# Patient Record
Sex: Male | Born: 1970 | Race: White | Hispanic: No | Smoking: Former smoker
Health system: Southern US, Community
[De-identification: ages and names within clinical notes are randomized; demographics above are authoritative.]

## PROBLEM LIST (undated history)

## (undated) DIAGNOSIS — G4730 Sleep apnea, unspecified: Secondary | ICD-10-CM

## (undated) DIAGNOSIS — I10 Essential (primary) hypertension: Secondary | ICD-10-CM

## (undated) DIAGNOSIS — E349 Endocrine disorder, unspecified: Secondary | ICD-10-CM

## (undated) DIAGNOSIS — M1990 Unspecified osteoarthritis, unspecified site: Secondary | ICD-10-CM

## (undated) DIAGNOSIS — R002 Palpitations: Secondary | ICD-10-CM

## (undated) DIAGNOSIS — G4700 Insomnia, unspecified: Secondary | ICD-10-CM

## (undated) DIAGNOSIS — L309 Dermatitis, unspecified: Secondary | ICD-10-CM

## (undated) HISTORY — DX: Insomnia, unspecified: G47.00

## (undated) HISTORY — DX: Palpitations: R00.2

## (undated) HISTORY — DX: Essential (primary) hypertension: I10

## (undated) HISTORY — DX: Dermatitis, unspecified: L30.9

## (undated) HISTORY — PX: ANKLE RECONSTRUCTION: SHX1151

## (undated) HISTORY — DX: Unspecified osteoarthritis, unspecified site: M19.90

## (undated) HISTORY — DX: Sleep apnea, unspecified: G47.30

## (undated) HISTORY — DX: Endocrine disorder, unspecified: E34.9

## (undated) HISTORY — PX: SPINE SURGERY: SHX786

---

## 2004-08-30 ENCOUNTER — Ambulatory Visit: Payer: Self-pay | Admitting: Family Medicine

## 2005-01-01 ENCOUNTER — Ambulatory Visit: Payer: Self-pay | Admitting: Family Medicine

## 2005-04-30 ENCOUNTER — Ambulatory Visit: Payer: Self-pay | Admitting: Family Medicine

## 2005-09-06 ENCOUNTER — Ambulatory Visit: Payer: Self-pay | Admitting: Family Medicine

## 2005-09-27 ENCOUNTER — Ambulatory Visit: Payer: Self-pay | Admitting: Family Medicine

## 2006-10-08 ENCOUNTER — Ambulatory Visit: Payer: Self-pay | Admitting: Family Medicine

## 2006-12-19 ENCOUNTER — Ambulatory Visit: Payer: Self-pay | Admitting: Family Medicine

## 2009-10-21 ENCOUNTER — Ambulatory Visit (HOSPITAL_COMMUNITY): Admission: RE | Admit: 2009-10-21 | Discharge: 2009-10-22 | Payer: Self-pay | Admitting: Neurosurgery

## 2010-08-31 ENCOUNTER — Observation Stay (HOSPITAL_COMMUNITY): Admission: EM | Admit: 2010-08-31 | Discharge: 2009-10-26 | Payer: Self-pay | Admitting: Emergency Medicine

## 2010-12-11 LAB — URINE MICROSCOPIC-ADD ON

## 2010-12-11 LAB — URINALYSIS, ROUTINE W REFLEX MICROSCOPIC
Glucose, UA: NEGATIVE mg/dL
Specific Gravity, Urine: 1.024 (ref 1.005–1.030)

## 2010-12-11 LAB — BASIC METABOLIC PANEL
BUN: 10 mg/dL (ref 6–23)
CO2: 28 mEq/L (ref 19–32)
CO2: 30 mEq/L (ref 19–32)
Calcium: 8.5 mg/dL (ref 8.4–10.5)
Creatinine, Ser: 0.81 mg/dL (ref 0.4–1.5)
GFR calc Af Amer: >60 mL/min (ref >60)
GFR calc Af Amer: >60 mL/min (ref >60)
Sodium: 138 mEq/L (ref 135–145)

## 2010-12-11 LAB — CBC
HCT: 42 % (ref 39.0–52.0)
MCV: 87.4 fL (ref 78.0–100.0)
Platelets: 225 10*3/uL (ref 150–400)
Platelets: 245 10*3/uL (ref 150–400)
RBC: 4.81 MIL/uL (ref 4.22–5.81)
WBC: 8.2 10*3/uL (ref 4.0–10.5)
WBC: 9.1 10*3/uL (ref 4.0–10.5)

## 2010-12-11 LAB — DIFFERENTIAL
Basophils Relative: 0 % (ref 0–1)
Eosinophils Absolute: 0.2 10*3/uL (ref 0.0–0.7)
Eosinophils Relative: 2 % (ref 0–5)
Lymphs Abs: 2.3 10*3/uL (ref 0.7–4.0)

## 2010-12-14 LAB — BASIC METABOLIC PANEL
BUN: 13 mg/dL (ref 6–23)
Calcium: 8.7 mg/dL (ref 8.4–10.5)
GFR calc Af Amer: >60 mL/min (ref >60)
GFR calc non Af Amer: >60 mL/min (ref >60)
Glucose, Bld: 114 mg/dL — ABNORMAL HIGH (ref 70–99)
Potassium: 4.1 mEq/L (ref 3.5–5.1)

## 2011-01-25 ENCOUNTER — Other Ambulatory Visit (HOSPITAL_COMMUNITY): Payer: Self-pay | Admitting: Neurosurgery

## 2011-01-25 DIAGNOSIS — M5410 Radiculopathy, site unspecified: Secondary | ICD-10-CM

## 2011-01-25 DIAGNOSIS — M5020 Other cervical disc displacement, unspecified cervical region: Secondary | ICD-10-CM

## 2011-02-01 IMAGING — CR DG CHEST 2V
2 series · 2 of 2 positions shown · non-contrast
Comparison: None available.

CLINICAL DATA: Preoperative respiratory film for patient with
cervical disc herniation.

CHEST - 2 VIEW

[view not recorded (1 of 2)]
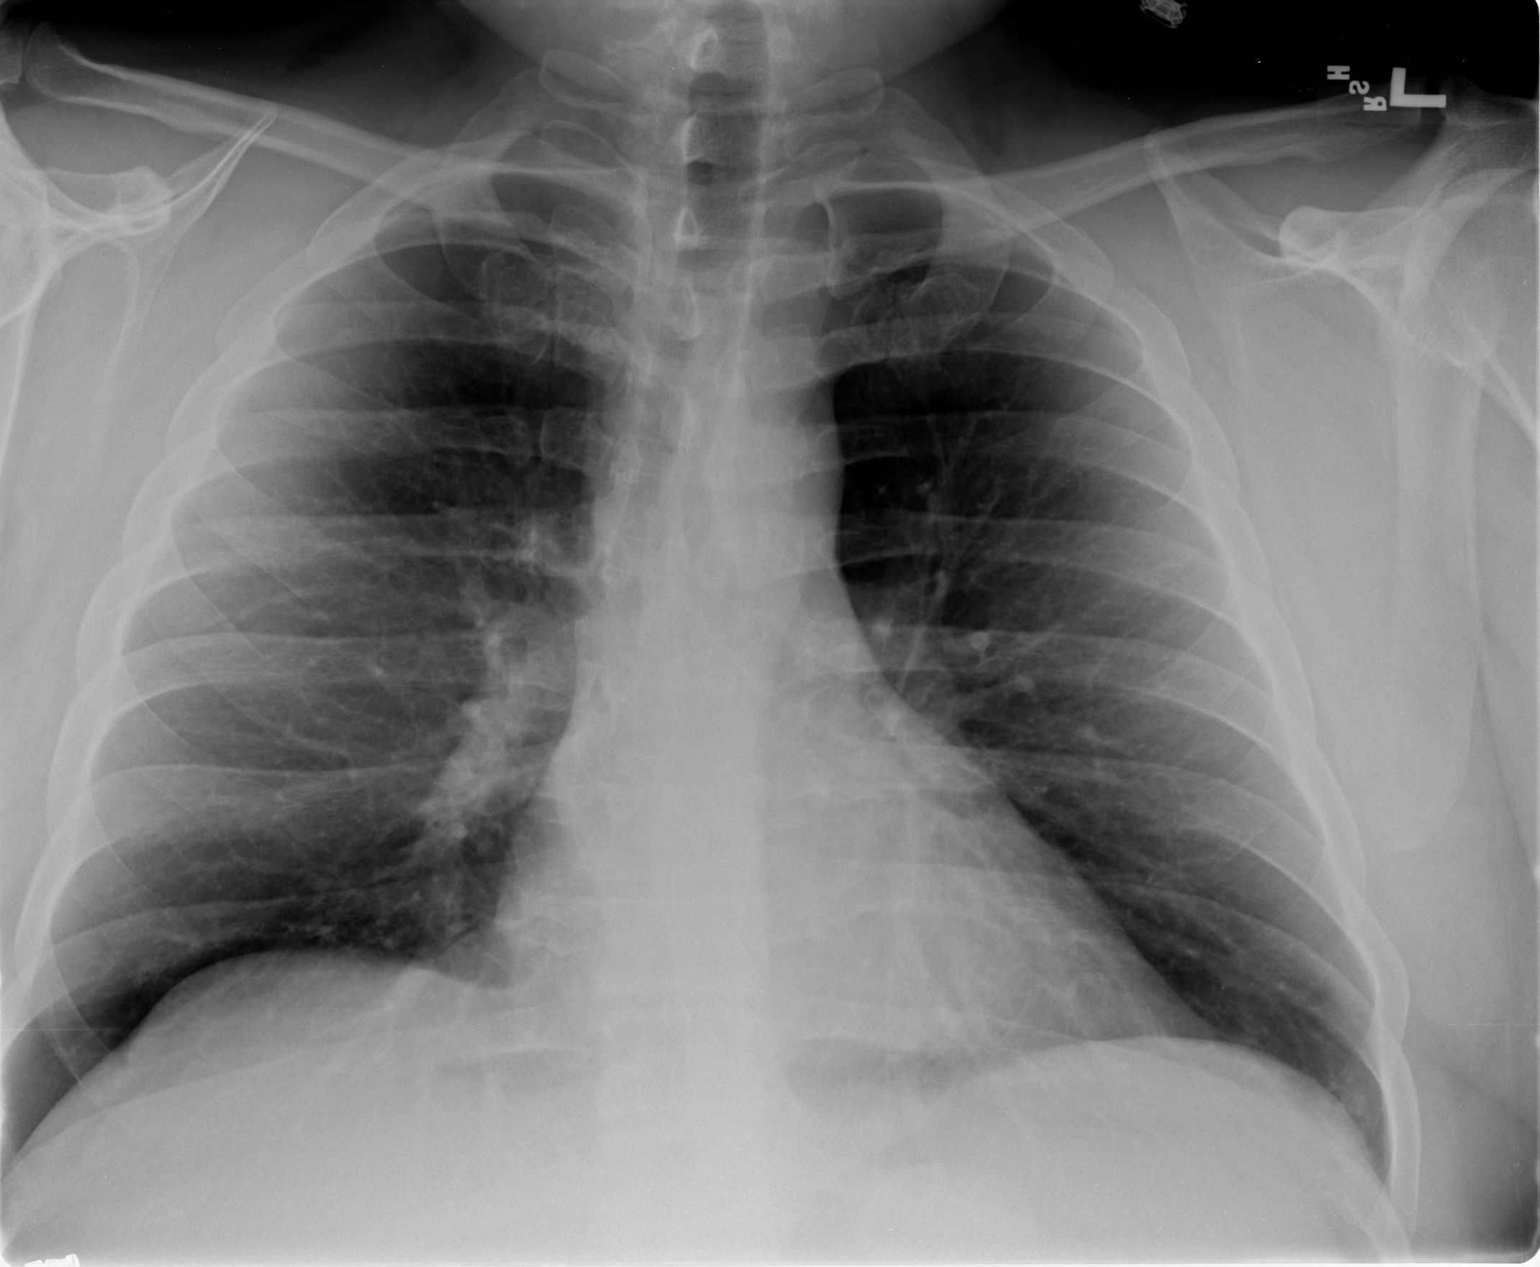

[view not recorded (2 of 2)]
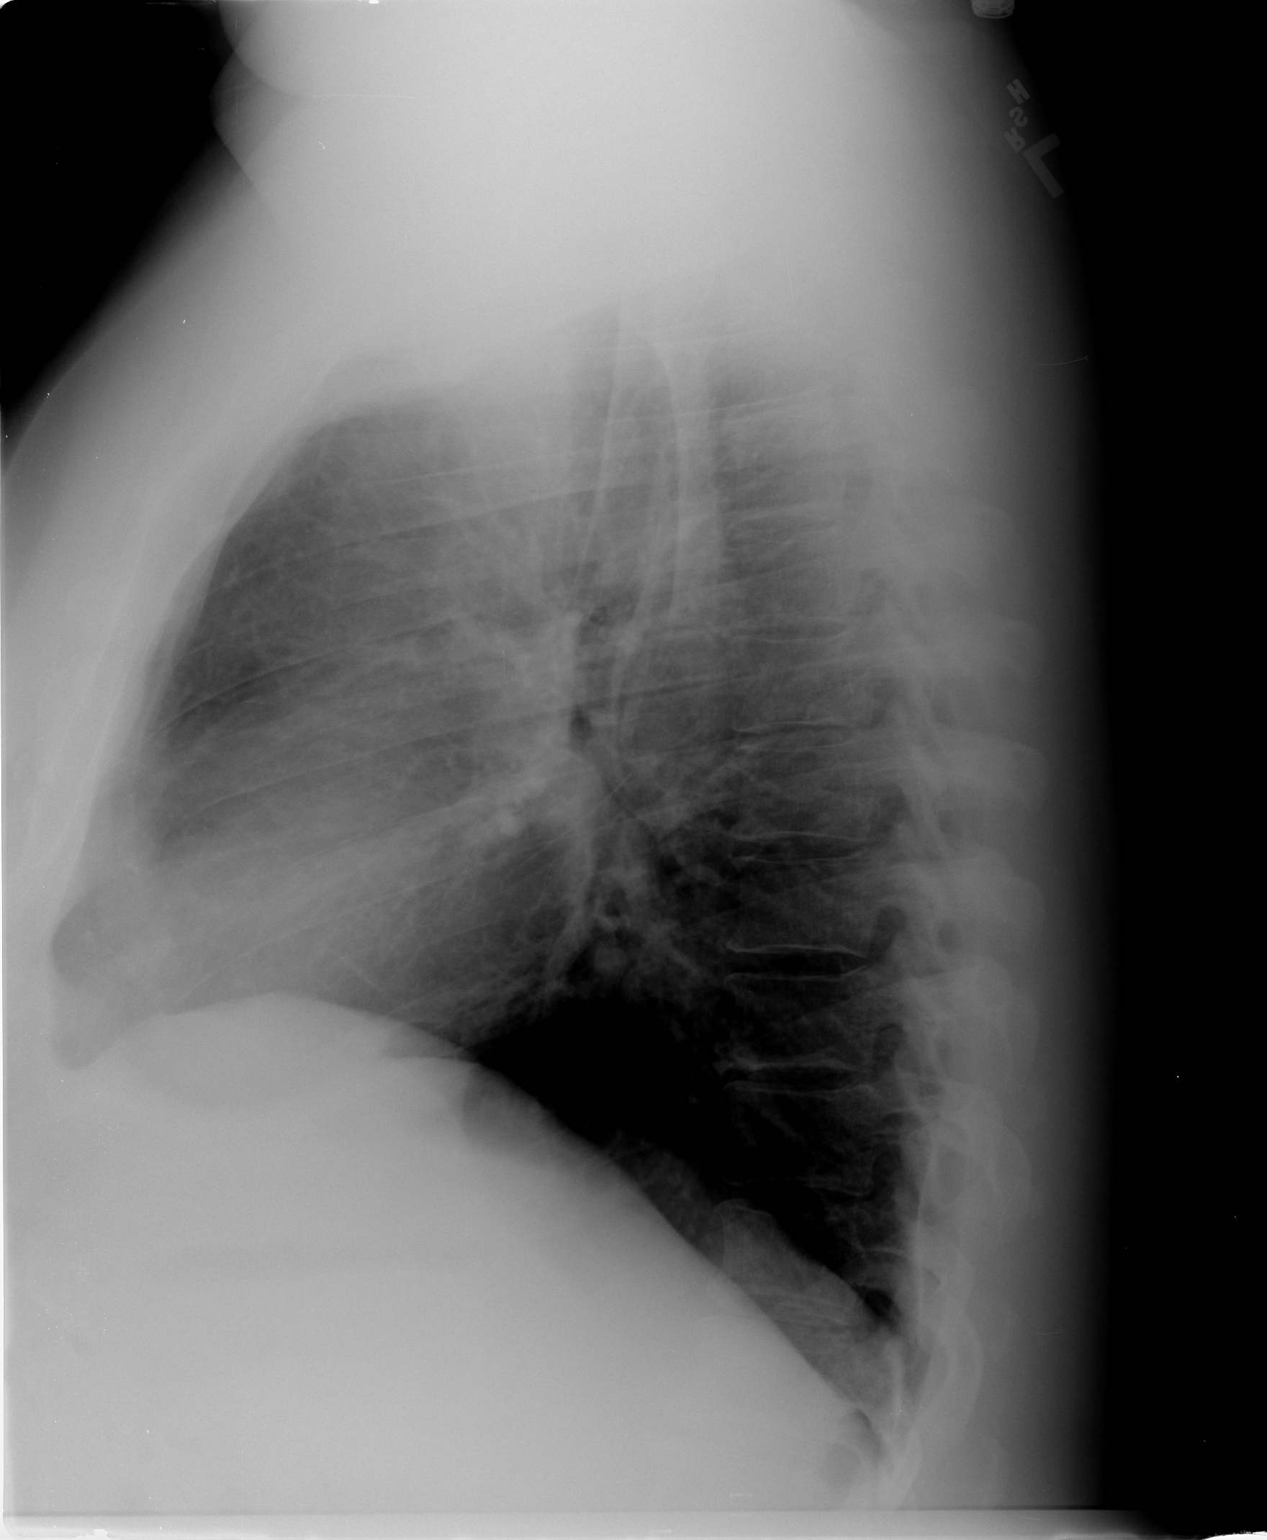

[2 of 2 positions shown; findings below may reference images not displayed]

FINDINGS: The lungs are clear.  Heart size is normal.  No pleural
effusion or focal bony abnormality.
IMPRESSION: No acute disease.

## 2011-02-04 IMAGING — CR DG CHEST 2V
2 series · 2 of 2 positions shown · non-contrast
Comparison: 10/21/2009

CLINICAL DATA: Bleeding edema post neck surgery

CHEST - 2 VIEW

[w chest pa]
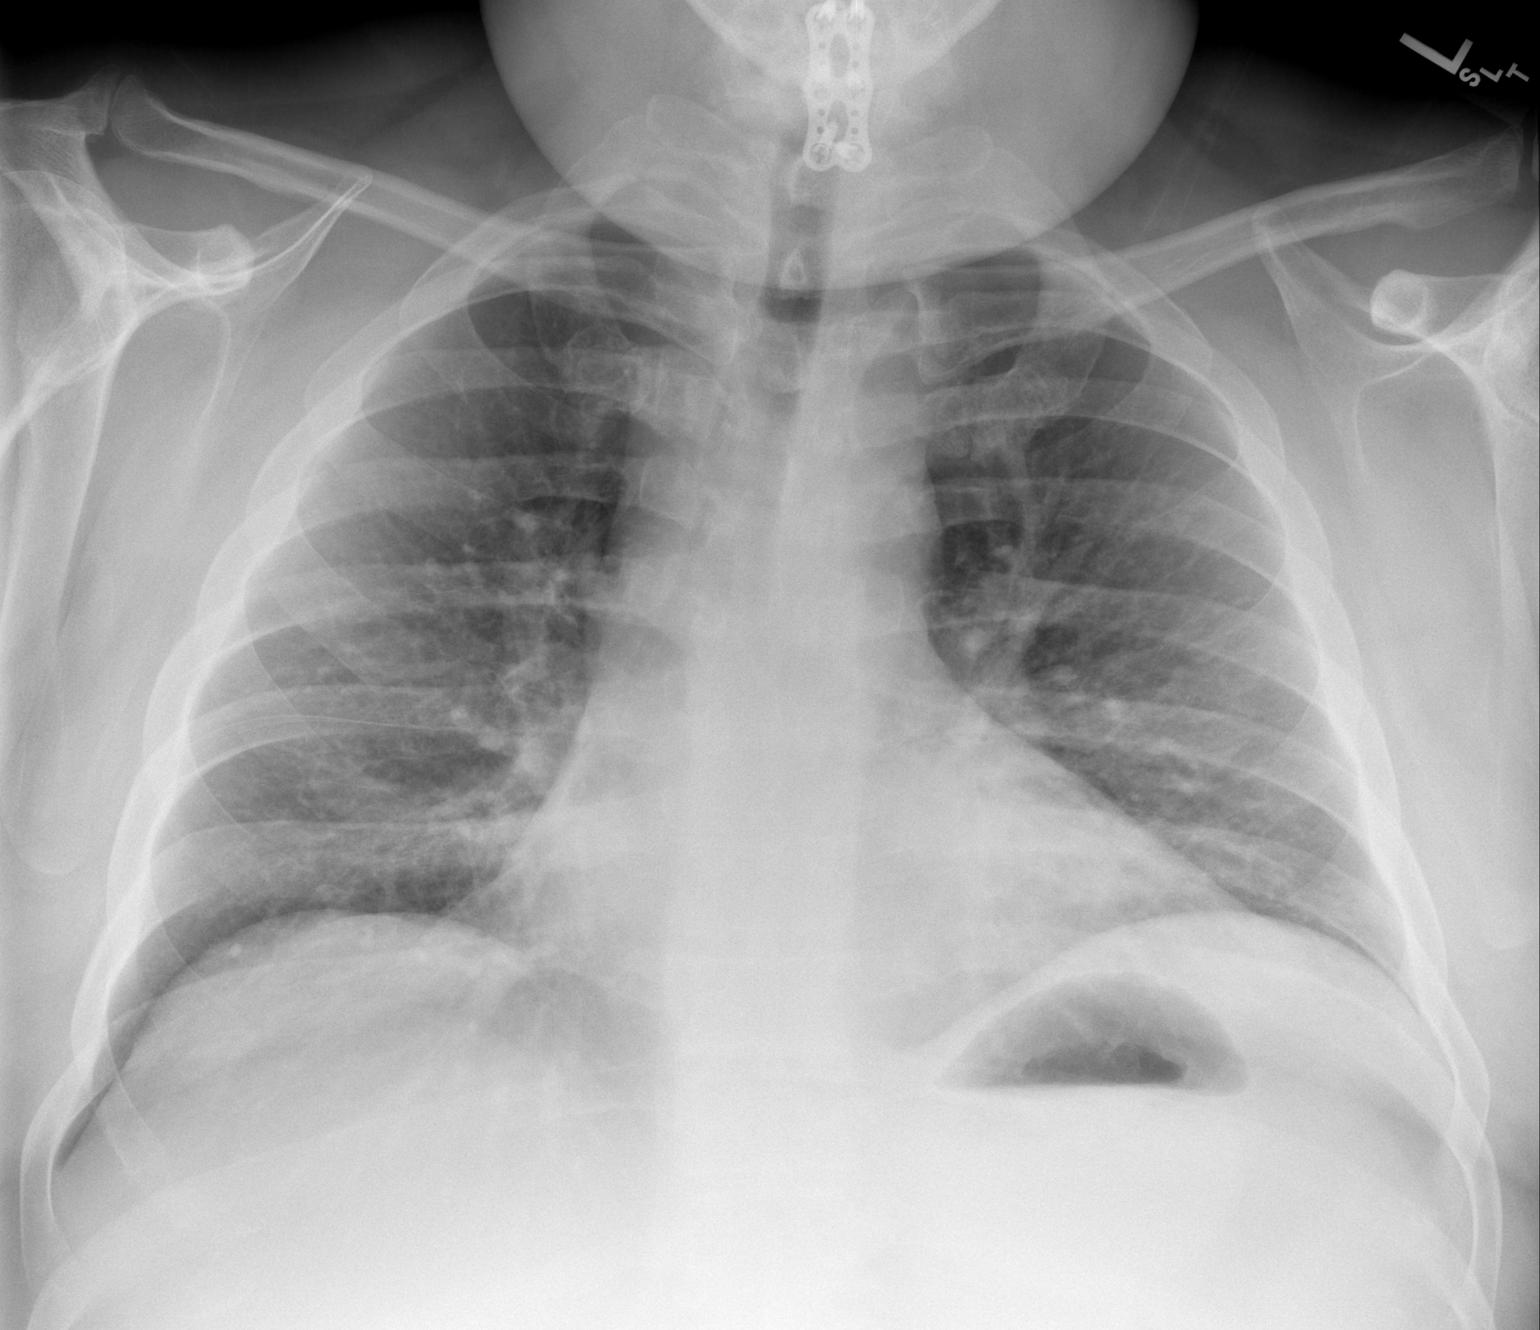

[w chest lat]
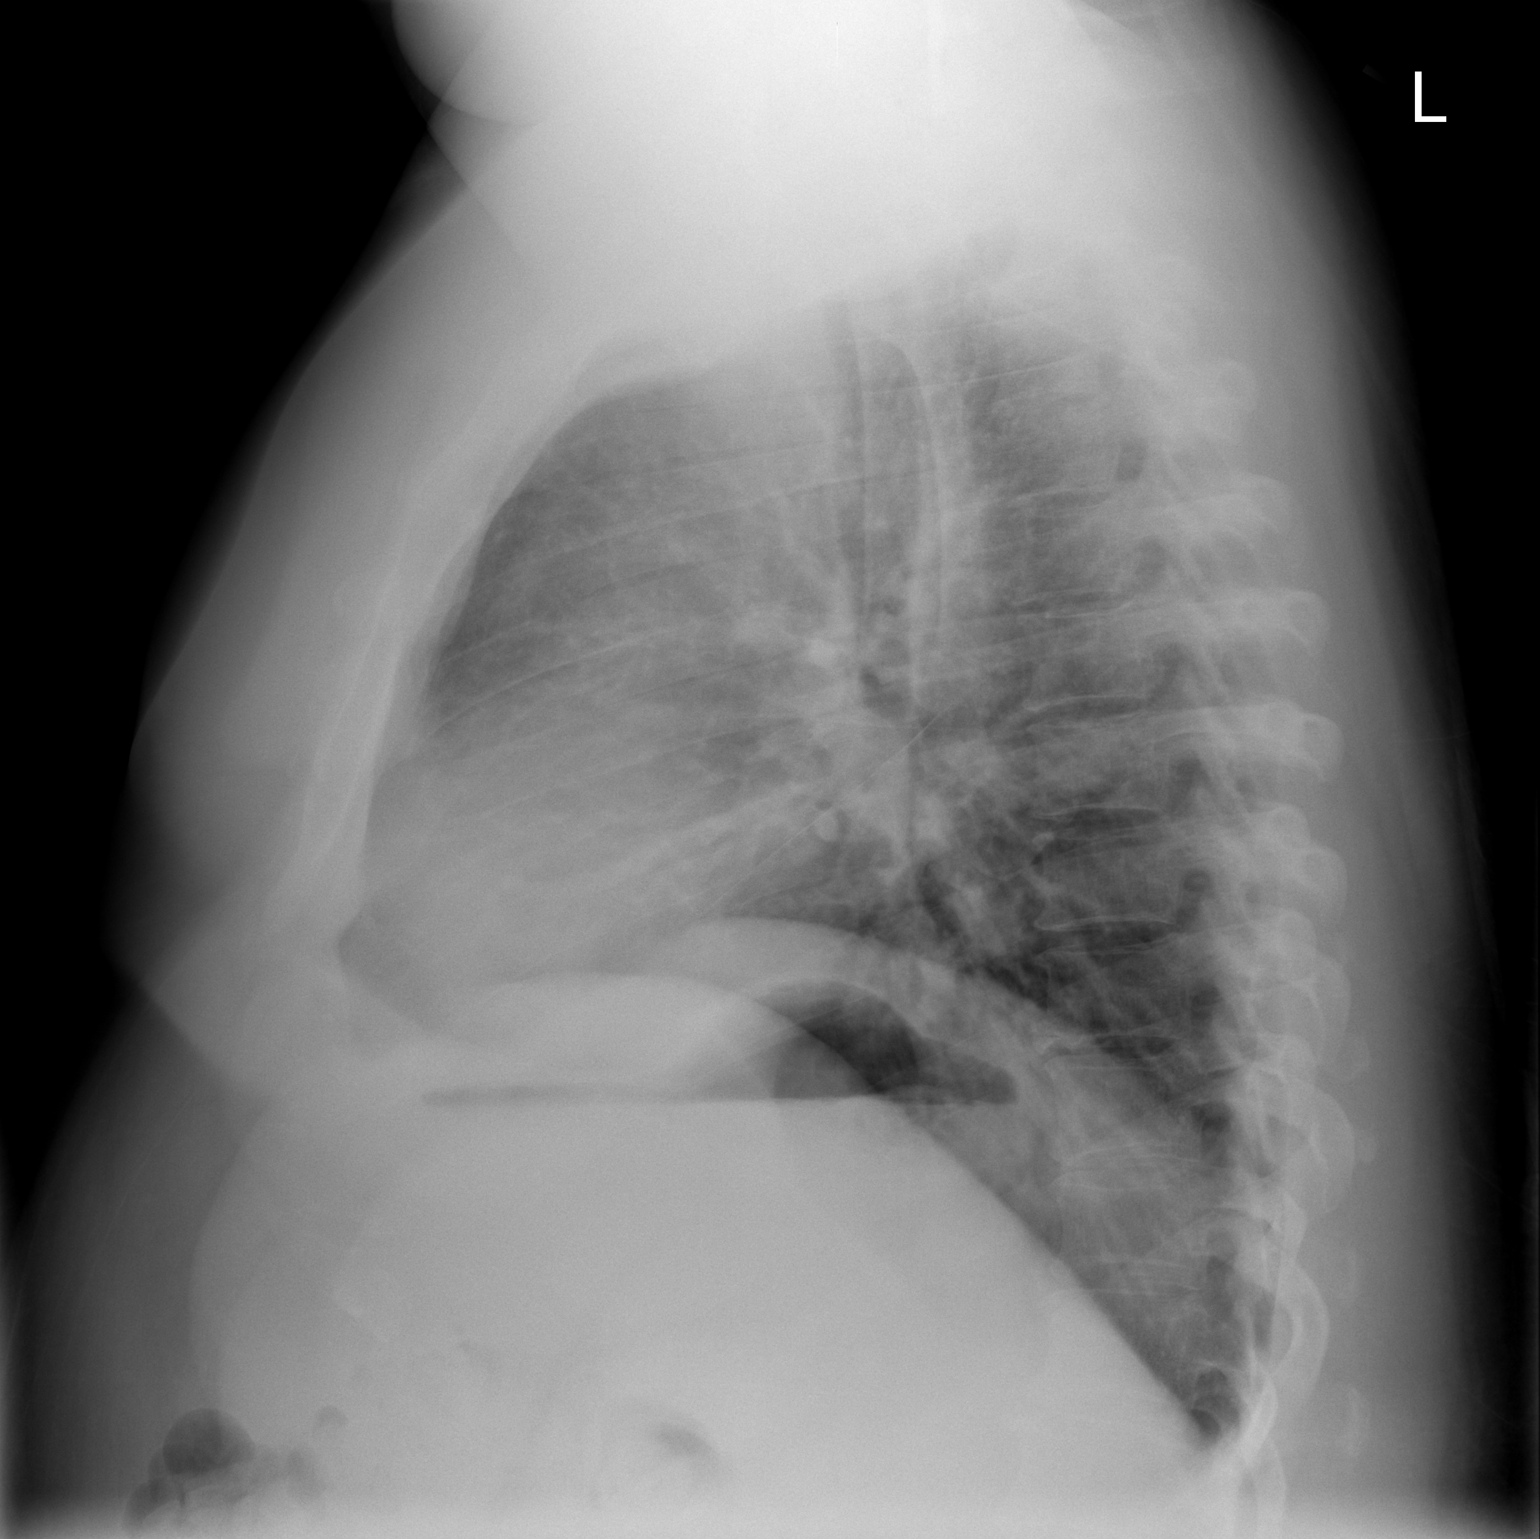

[2 of 2 positions shown; findings below may reference images not displayed]

FINDINGS: Low level of inspiration.  Heart size upper normal
considering.  No congestive heart failure or pleural fluid.  Lungs
clear.

Prior cervical fusion.
IMPRESSION: Suboptimal level of inspiration - no congestive heart failure or
active disease.

## 2011-02-05 ENCOUNTER — Ambulatory Visit (HOSPITAL_COMMUNITY)
Admission: RE | Admit: 2011-02-05 | Discharge: 2011-02-05 | Disposition: A | Discharge status: Home / Self Care | Payer: Worker's Compensation | Source: Ambulatory Visit | Attending: Neurosurgery | Admitting: Neurosurgery

## 2011-02-05 ENCOUNTER — Other Ambulatory Visit (HOSPITAL_COMMUNITY): Payer: Self-pay

## 2011-02-05 ENCOUNTER — Ambulatory Visit (HOSPITAL_COMMUNITY): Payer: Worker's Compensation

## 2011-02-05 DIAGNOSIS — M5020 Other cervical disc displacement, unspecified cervical region: Secondary | ICD-10-CM

## 2011-02-05 DIAGNOSIS — M5410 Radiculopathy, site unspecified: Secondary | ICD-10-CM

## 2011-02-08 ENCOUNTER — Ambulatory Visit (HOSPITAL_COMMUNITY): Admission: RE | Admit: 2011-02-08 | Payer: Worker's Compensation | Source: Ambulatory Visit

## 2011-02-08 ENCOUNTER — Ambulatory Visit (HOSPITAL_COMMUNITY)
Admission: RE | Admit: 2011-02-08 | Discharge: 2011-02-08 | Disposition: A | Discharge status: Home / Self Care | Payer: Worker's Compensation | Source: Ambulatory Visit | Attending: Neurosurgery | Admitting: Neurosurgery

## 2011-02-08 ENCOUNTER — Other Ambulatory Visit (HOSPITAL_COMMUNITY): Payer: Self-pay | Admitting: Neurosurgery

## 2011-02-08 DIAGNOSIS — M4802 Spinal stenosis, cervical region: Secondary | ICD-10-CM | POA: Insufficient documentation

## 2011-02-08 DIAGNOSIS — M5020 Other cervical disc displacement, unspecified cervical region: Secondary | ICD-10-CM

## 2011-02-08 DIAGNOSIS — IMO0002 Reserved for concepts with insufficient information to code with codable children: Secondary | ICD-10-CM | POA: Insufficient documentation

## 2011-02-08 DIAGNOSIS — M5410 Radiculopathy, site unspecified: Secondary | ICD-10-CM

## 2011-02-08 DIAGNOSIS — M79609 Pain in unspecified limb: Secondary | ICD-10-CM | POA: Insufficient documentation

## 2011-02-08 DIAGNOSIS — Z981 Arthrodesis status: Secondary | ICD-10-CM | POA: Insufficient documentation

## 2011-02-08 DIAGNOSIS — R29898 Other symptoms and signs involving the musculoskeletal system: Secondary | ICD-10-CM | POA: Insufficient documentation

## 2011-02-08 DIAGNOSIS — M6281 Muscle weakness (generalized): Secondary | ICD-10-CM | POA: Insufficient documentation

## 2011-02-08 LAB — POCT I-STAT, CHEM 8
Chloride: 104 mEq/L (ref 96–112)
Creatinine, Ser: 1 mg/dL (ref 0.4–1.5)
Glucose, Bld: 103 mg/dL — ABNORMAL HIGH (ref 70–99)
HCT: 47 % (ref 39.0–52.0)
Potassium: 4.1 mEq/L (ref 3.5–5.1)
Sodium: 137 mEq/L (ref 135–145)

## 2011-02-08 MED ORDER — IOHEXOL 300 MG/ML  SOLN
10.0000 mL | Freq: Once | INTRAMUSCULAR | Status: AC | PRN
  Administered 2011-02-08: 10 mL via INTRATHECAL

## 2012-05-21 IMAGING — CT CT CERVICAL SPINE W/ CM
4 of 5 series · 13 of 28 positions shown, 14 images · IV contrast (omnipaque)
Comparison: CT cervical spine 10/23/2009

CLINICAL DATA: Radiculopathy.  Right arm weakness.  Left arm pain.

MYELOGRAM INJECTION
TECHNIQUE: Informed consent was obtained from the patient prior to
the procedure, including potential complications of headache,
allergy, infection and pain.  A timeout procedure was performed.
With the patient prone, the lower back was prepped with Betadine.
1% Lidocaine was used for local anesthesia.  Lumbar puncture was
performed at the left paramidline L2-3 level using a 22 gauge
needle with return of clear CSF.  10.0 ml of Omnipaque 583was
injected into the subarachnoid space .
TECHNIQUE: Following injection of intrathecal Omnipaque contrast,
spine imaging in multiple projections was performed using
fluoroscopy.
Fluoroscopy Time: 1.07 minutes.
TECHNIQUE: CT imaging of the cervical spine was performed after
intrathecal contrast administration. Multiplanar CT image
reconstructions were also generated.

[Series 2: cervical spine · axial · 0.27mm/px · z∈[+90,+185]mm · 3 of 76 slices shown, 4 images]
[im 19/76  soft-tissue]
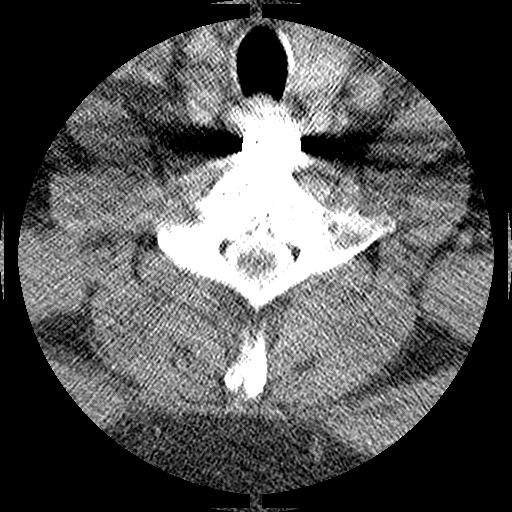
[im 19/76  bone]
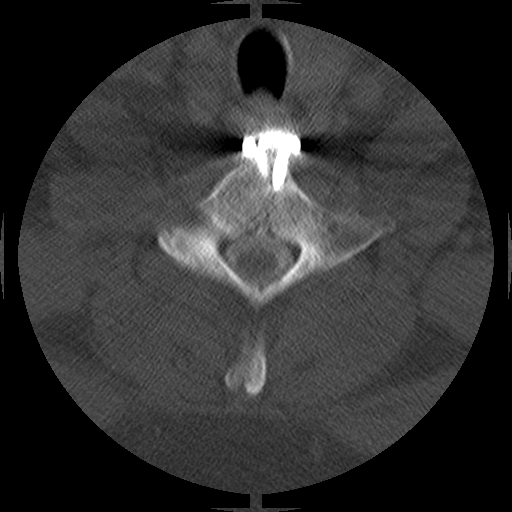
[im 38/76  bone]
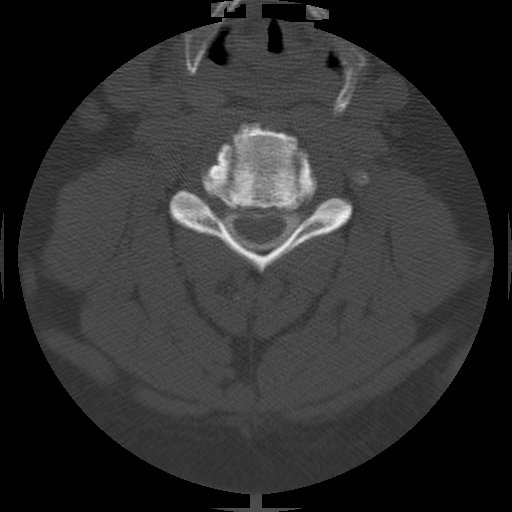
[im 57/76  bone]
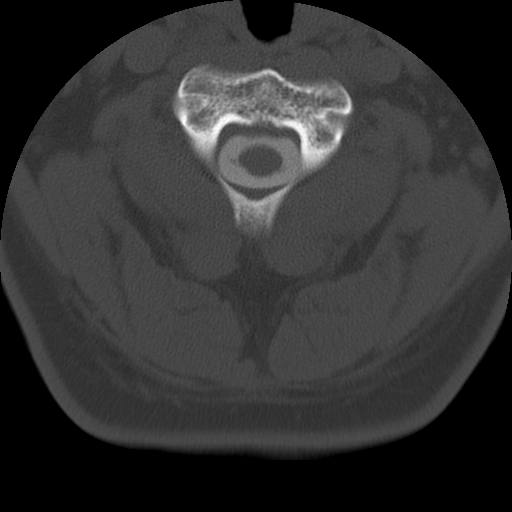

[Series 3: recon 2: cervical spine · axial · 0.27mm/px · z∈[+108,+170]mm · 2 of 76 slices shown]
[im 26/76  bone]
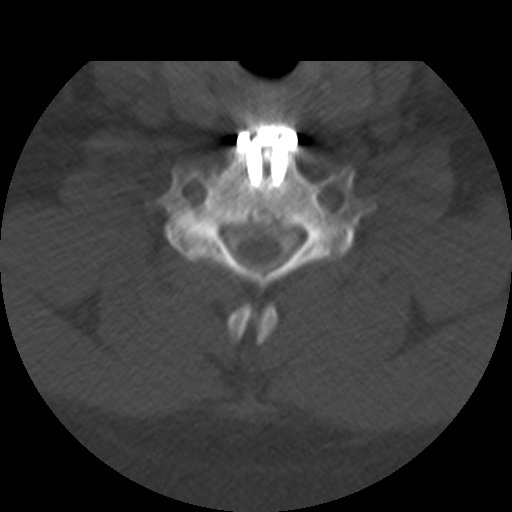
[im 51/76  bone]
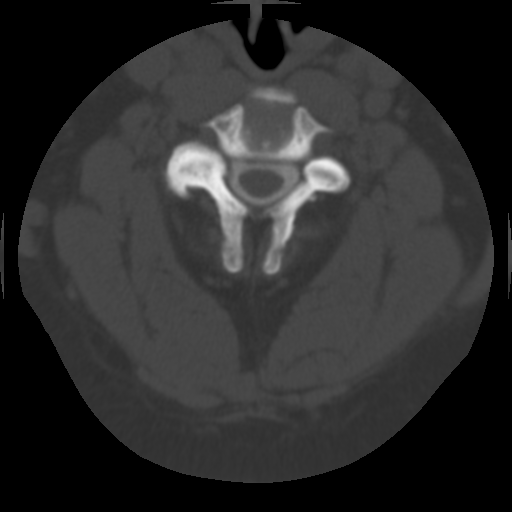

[Series 400: cor · coronal · 0.38mm/px · 5 of 51 slices shown]
[im 18/51  bone]
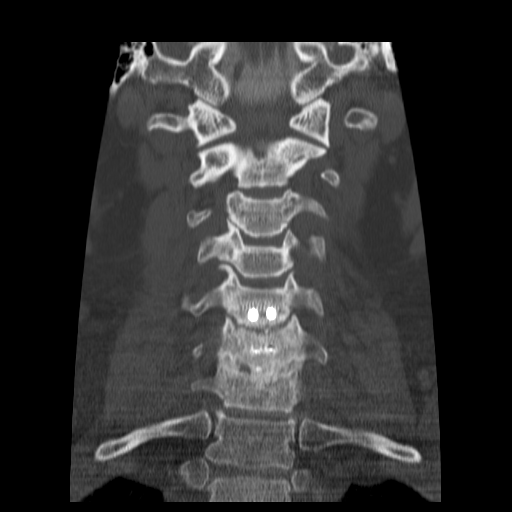
[im 22/51  bone]
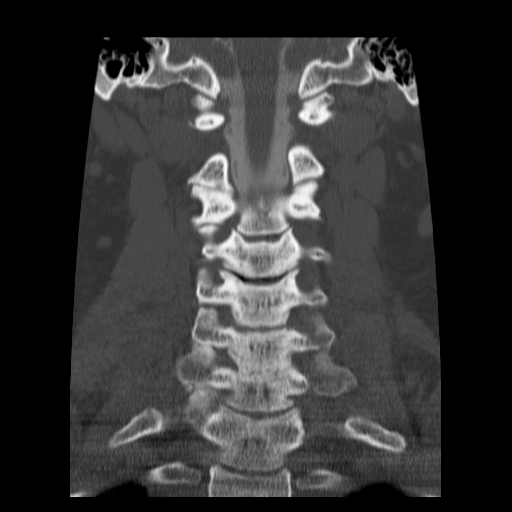
[im 27/51  bone]
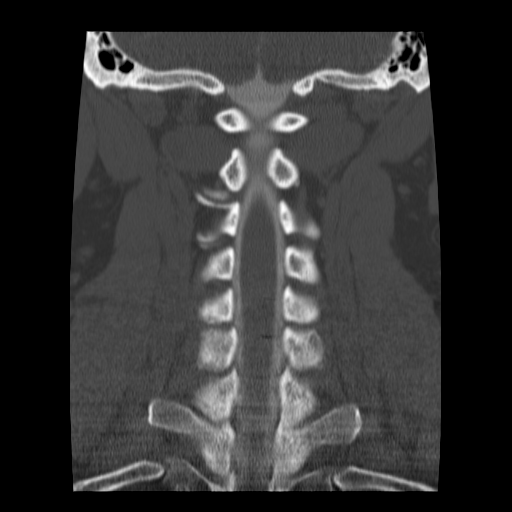
[im 31/51  bone]
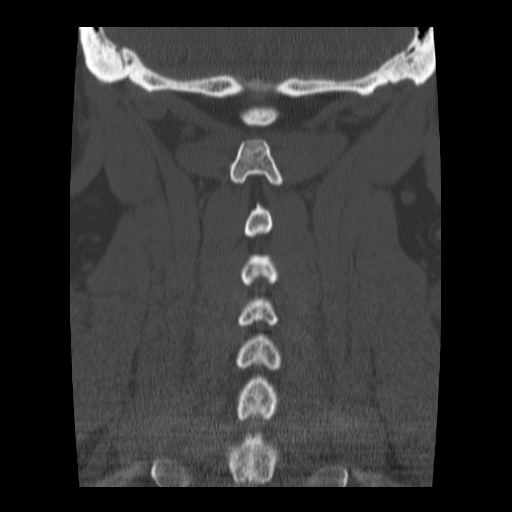
[im 35/51  bone]
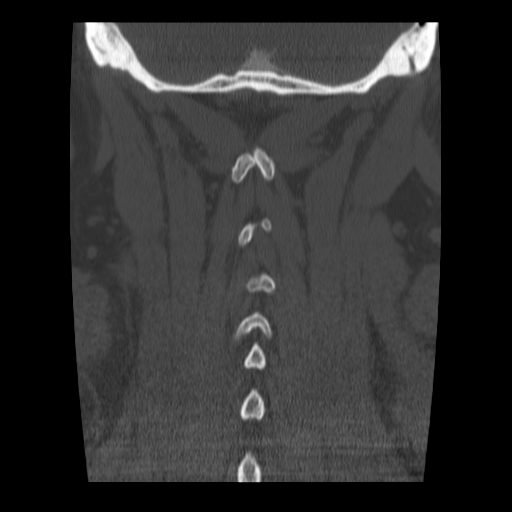

[Series 402: orthoganal · axial · 0.38mm/px · z∈[+97,+182]mm · 3 of 88 slices shown]
[im 22/88  bone]
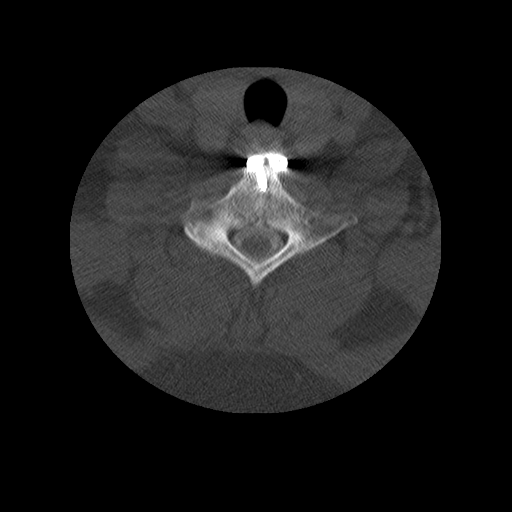
[im 44/88  bone]
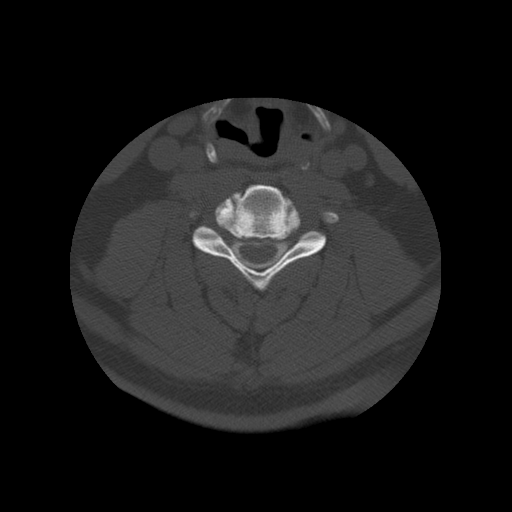
[im 66/88  bone]
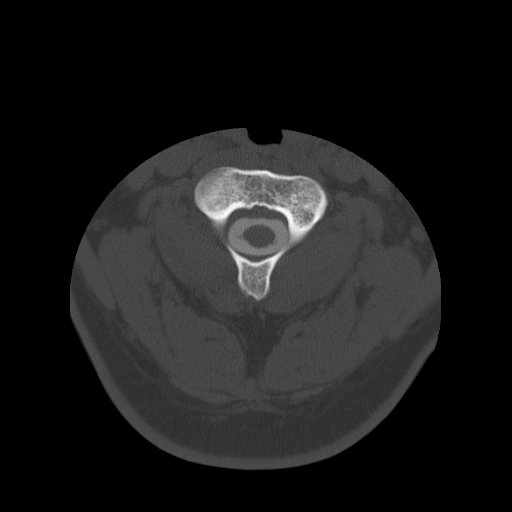

[13 of 28 positions shown; findings below may reference images not displayed]

IMPRESSION: Successful injection of  intrathecal contrast for myelography.

MYELOGRAM CERVICAL
FINDINGS: The patient is status post anterior fusion at C5-6 and C6-
7.  A prominent disc osteophyte complex at C3-4 effaces the ventral
CSF with significant narrowing of the central canal.  A prominent
disc osteophyte complex is present at C4-5 with less severe
effacement of the ventral CSF.
IMPRESSION: 1.  Broad-based disc osteophyte complex at C3-4 with associated
central canal stenosis.
2.  Smaller disc osteophyte complex at C4-5.
3.  Status post anterior fusion at C5-6 and C6-7.


CT MYELOGRAPHY CERVICAL SPINE
FINDINGS: The cervical spine is imaged from the skull base through
T1-2.  The patient is status post anterior fusion at C5-6 and C6-7.
The osseous fusion is mature across the disc space at C6-7.
Minimal anterior bridging bone is present at C5-6.

Reversal of the normal cervical lordosis is similar to the prior
exam.  The soft tissues of the neck are unremarkable.  The
individual disc levels are as follows.

C2-3:  No significant disc herniation or stenosis is present.

C3-4:  A broad-based disc osteophyte complex is present.  This
effaces the ventral CSF and may contact the cord.  Mild
uncovertebral disease is evident bilaterally.  No significant
foraminal stenosis is present.

C4-5:  A broad-based disc osteophyte complex partially effaces the
ventral CSF.  Prominent uncovertebral disease is seen bilaterally.
Mild foraminal stenosis is seen bilaterally, worse on the right.

C5-6:  The patient is fused.  Minimal posterior osteophytic ridging
is present with slight effacement ventral CSF.  No focal stenosis
is present.

C6-7:  Right paracentral osteophyte effaces the ventral CSF and
could contact the cord.  The foramen are patent bilaterally.

C7-T1:  Left-sided uncovertebral spurring leads to mild foraminal
narrowing.
IMPRESSION: 1.  Mild to moderate central canal stenosis at C3-4 secondary to a
broad-based disc osteophyte complex.
2.  Mild foraminal stenosis bilaterally at C4-5 is worse on the
right.
3.  Mild central canal stenosis at C4-5.
4.  Status post anterior fusion at C5-6 and C6-7.  There is only
minimal bridging bone anteriorly at C5-6.  Bridging bone extends
across the entire endplate at C6-7.
5.  Right paracentral posterior osteophyte at C6-7 potentially
contacts the cord with mild to moderate central canal stenosis.

## 2012-10-27 ENCOUNTER — Other Ambulatory Visit (HOSPITAL_COMMUNITY): Payer: Self-pay | Admitting: Pulmonary Disease

## 2012-10-27 ENCOUNTER — Ambulatory Visit (HOSPITAL_COMMUNITY)
Admission: RE | Admit: 2012-10-27 | Discharge: 2012-10-27 | Disposition: A | Discharge status: Home / Self Care | Payer: PRIVATE HEALTH INSURANCE | Source: Ambulatory Visit | Attending: Pulmonary Disease | Admitting: Pulmonary Disease

## 2012-10-27 DIAGNOSIS — M25529 Pain in unspecified elbow: Secondary | ICD-10-CM

## 2014-08-30 DIAGNOSIS — E291 Testicular hypofunction: Secondary | ICD-10-CM | POA: Insufficient documentation

## 2015-05-26 DIAGNOSIS — R600 Localized edema: Secondary | ICD-10-CM | POA: Insufficient documentation

## 2015-05-26 DIAGNOSIS — I10 Essential (primary) hypertension: Secondary | ICD-10-CM | POA: Insufficient documentation

## 2019-05-01 ENCOUNTER — Ambulatory Visit: Payer: Self-pay | Admitting: Family

## 2019-05-05 ENCOUNTER — Other Ambulatory Visit: Payer: Self-pay

## 2019-05-06 ENCOUNTER — Encounter: Payer: Self-pay | Admitting: Family Medicine

## 2019-05-06 ENCOUNTER — Ambulatory Visit: Payer: Managed Care, Other (non HMO) | Admitting: Family Medicine

## 2019-05-06 VITALS — BP 159/94 | HR 90 | Temp 98.9°F | Resp 18 | Ht 72.0 in | Wt 373.0 lb

## 2019-05-06 DIAGNOSIS — Z7689 Persons encountering health services in other specified circumstances: Secondary | ICD-10-CM | POA: Diagnosis not present

## 2019-05-06 DIAGNOSIS — I10 Essential (primary) hypertension: Secondary | ICD-10-CM | POA: Diagnosis not present

## 2019-05-06 DIAGNOSIS — Z1211 Encounter for screening for malignant neoplasm of colon: Secondary | ICD-10-CM

## 2019-05-06 DIAGNOSIS — R002 Palpitations: Secondary | ICD-10-CM | POA: Diagnosis not present

## 2019-05-06 DIAGNOSIS — Z1212 Encounter for screening for malignant neoplasm of rectum: Secondary | ICD-10-CM

## 2019-05-06 DIAGNOSIS — E291 Testicular hypofunction: Secondary | ICD-10-CM

## 2019-05-06 MED ORDER — OLMESARTAN MEDOXOMIL 20 MG PO TABS: 20.0000 mg | ORAL_TABLET | Freq: Every day | ORAL | 3 refills | Status: DC

## 2019-05-06 NOTE — Progress Notes (Signed)
Subjective:  Patient ID: Francisco Adams, male    DOB: Mar 24, 1971, 48 y.o.   MRN: [id number]  Patient Care Team: Baruch Gouty, FNP as PCP - General (Family Medicine)   Chief Complaint:  Establish Care ([place])   HPI: Francisco Adams is a 48 y.o. male presenting on 05/06/2019 for Establish Care ([place])   1. Encounter to establish care  Pt presents today to establish care. Pt was seeing Dr. Edrick Oh who has retired.    2. Palpitations  On metoprolol for rate control. No recent episodes. Had negative stress test and EKG when this started.    3. Essential hypertension with goal blood pressure less than 130/80  Taking metoprolol only. States BP is always elevated, running 150/90 most of the time. He states a few months ago he did have some visual disturbances. States this lasted for about 5 minutes and then went away. Pt states he is planning on following up with ophthalmology. He denies dizziness, chest pain, shortness of breath, dizziness, headaches, or confusion. States he does have lower leg edema. States mostly at the end of work. Subsides with elevations.     4. Hypogonadism in male  On testosterone. Last level drawn in 2019 and was low. Will place orders for testosterone level draw in am. Pt aware this needs to be drawn prior to 10 am.    5. Morbid obesity ([place])  Does not watch diet or exercise on a regular basis.      Relevant past medical, surgical, family, and social history reviewed and updated as indicated.  Allergies and medications reviewed and updated. Date reviewed: Chart in Epic.   Past Medical History:  Diagnosis Date   Arthritis    Dermatitis    head  and face    Palpitations    Sleep apnea    cpap    Testosterone deficiency     Past Surgical History:  Procedure Laterality Date   ANKLE RECONSTRUCTION Right    SPINE SURGERY     cervical - 3 disc fusions    Social History   Socioeconomic History   Marital status: Divorced    Spouse name:  Not on file   Number of children: 3   Years of education: Not on file   Highest education level: Not on file  Occupational History   Not on file  Social Needs   Financial resource strain: Not on file   Food insecurity    Worry: Not on file    Inability: Not on file   Transportation needs    Medical: Not on file    Non-medical: Not on file  Tobacco Use   Smoking status: Former Smoker   Smokeless tobacco: Current User    Types: Chew  Substance and Sexual Activity   Alcohol use: Never    Frequency: Never   Drug use: Never   Sexual activity: Not on file  Lifestyle   Physical activity    Days per week: Not on file    Minutes per session: Not on file   Stress: Not on file  Relationships   Social connections    Talks on phone: Not on file    Gets together: Not on file    Attends religious service: Not on file    Active member of club or organization: Not on file    Attends meetings of clubs or organizations: Not on file    Relationship status: Not on file   Intimate partner violence  Fear of current or ex partner: Not on file    Emotionally abused: Not on file    Physically abused: Not on file    Forced sexual activity: Not on file  Other Topics Concern   Not on file  Social History Narrative   Not on file    Outpatient Encounter Medications as of 05/06/2019  Medication Sig   clotrimazole-betamethasone (LOTRISONE) cream Apply 1 application topically 2 (two) times daily.   metoprolol succinate (TOPROL-XL) 25 MG 24 hr tablet Take 1 tablet by mouth daily.   Testosterone 20.25 MG/ACT (1.62%) GEL Apply 2 Pump topically daily.   olmesartan (BENICAR) 20 MG tablet Take 1 tablet (20 mg total) by mouth daily.   No facility-administered encounter medications on file as of 05/06/2019.     Not on File  Review of Systems  Constitutional: Negative for activity change, appetite change, chills, fatigue and fever.  HENT: Negative.   Eyes: Positive for visual  disturbance. Negative for photophobia.  Respiratory: Negative for cough, chest tightness and shortness of breath.   Cardiovascular: Positive for leg swelling. Negative for chest pain and palpitations.  Gastrointestinal: Negative for abdominal distention, abdominal pain, anal bleeding, blood in stool, constipation, diarrhea, nausea and vomiting.  Endocrine: Negative.  Negative for cold intolerance, heat intolerance, polydipsia, polyphagia and polyuria.  Genitourinary: Negative for decreased urine volume, difficulty urinating, discharge, dysuria, frequency, penile pain, penile swelling, scrotal swelling, testicular pain and urgency.  Musculoskeletal: Negative for arthralgias and myalgias.  Skin: Negative.  Negative for color change and pallor.  Allergic/Immunologic: Negative.   Neurological: Negative for dizziness, tremors, facial asymmetry, speech difficulty, weakness, light-headedness, numbness and headaches.  Hematological: Negative.  Does not bruise/bleed easily.  Psychiatric/Behavioral: Negative for agitation, confusion, hallucinations, sleep disturbance and suicidal ideas.  All other systems reviewed and are negative.       Objective:  BP (!) 159/94    Pulse 90    Temp 98.9 F (37.2 C)    Resp 18    Ht 6' (1.829 m)    Wt (!) 373 lb (169.2 kg)    BMI 50.59 kg/m    Wt Readings from Last 3 Encounters:  05/06/19 (!) 373 lb (169.2 kg)    Physical Exam Vitals signs and nursing note reviewed.  Constitutional:      General: He is not in acute distress.    Appearance: Normal appearance. He is well-developed and well-groomed. He is obese. He is not ill-appearing, toxic-appearing or diaphoretic.  HENT:     Head: Normocephalic and atraumatic.     Jaw: There is normal jaw occlusion.     Right Ear: Hearing normal.     Left Ear: Hearing normal.     Nose: Nose normal.     Mouth/Throat:     Lips: Pink.     Mouth: Mucous membranes are moist.     Pharynx: Oropharynx is clear. Uvula midline.    Eyes:     General: Lids are normal.     Extraocular Movements: Extraocular movements intact.     Right eye: Normal extraocular motion and no nystagmus.     Left eye: Normal extraocular motion and no nystagmus.     Conjunctiva/sclera: Conjunctivae normal.     Pupils: Pupils are equal, round, and reactive to light.     Visual Fields: Right eye visual fields normal and left eye visual fields normal.  Neck:     Musculoskeletal: Normal range of motion and neck supple.     Thyroid: No thyroid  mass, thyromegaly or thyroid tenderness.     Vascular: No carotid bruit or JVD.     Trachea: Trachea and phonation normal.  Cardiovascular:     Rate and Rhythm: Normal rate and regular rhythm.     Chest Wall: PMI is not displaced.     Pulses: Normal pulses.     Heart sounds: Normal heart sounds. No murmur. No friction rub. No gallop.   Pulmonary:     Effort: Pulmonary effort is normal. No respiratory distress.     Breath sounds: Normal breath sounds. No wheezing.  Abdominal:     General: Abdomen is protuberant. Bowel sounds are normal. There is no distension or abdominal bruit.     Palpations: Abdomen is soft. There is no hepatomegaly or splenomegaly.     Tenderness: There is no abdominal tenderness. There is no right CVA tenderness or left CVA tenderness.     Hernia: No hernia is present.  Musculoskeletal: Normal range of motion.     Right lower leg: 1+ Edema present.     Left lower leg: 1+ Edema present.  Lymphadenopathy:     Cervical: No cervical adenopathy.  Skin:    General: Skin is warm and dry.     Capillary Refill: Capillary refill takes less than 2 seconds.     Coloration: Skin is not cyanotic, jaundiced or pale.     Findings: No rash.  Neurological:     General: No focal deficit present.     Mental Status: He is alert and oriented to person, place, and time.     Cranial Nerves: Cranial nerves are intact.     Sensory: Sensation is intact.     Motor: Motor function is intact.      Coordination: Coordination is intact.     Gait: Gait is intact.     Deep Tendon Reflexes: Reflexes are normal and symmetric.  Psychiatric:        Attention and Perception: Attention and perception normal.        Mood and Affect: Mood and affect normal.        Speech: Speech normal.        Behavior: Behavior normal. Behavior is cooperative.        Thought Content: Thought content normal.        Cognition and Memory: Cognition and memory normal.        Judgment: Judgment normal.     Results for orders placed or performed during the hospital encounter of 02/08/11  I-STAT, chem 8  Result Value Ref Range   Sodium 137 135 - 145 mEq/L   Potassium 4.1 3.5 - 5.1 mEq/L   Chloride 104 96 - 112 mEq/L   BUN 26 (H) 6 - 23 mg/dL   Creatinine, Ser 1.00 0.4 - 1.5 mg/dL   Glucose, Bld 103 (H) 70 - 99 mg/dL   Calcium, Ion 1.09 (L) 1.12 - 1.32 mmol/L   TCO2 27 0 - 100 mmol/L   Hemoglobin 16.0 13.0 - 17.0 g/dL   HCT 47.0 39.0 - 52.0 %       Pertinent labs & imaging results that were available during my care of the patient were reviewed by me and considered in my medical decision making.  Assessment & Plan:  [place] was seen today for establish care.  Diagnoses and all orders for this visit:  Encounter to establish care -     CMP14+EGFR; Future -     CBC with Differential/Platelet; Future -  Lipid panel; Future -     Thyroid Panel With TSH; Future -     Testosterone,Free and Total; Future  Palpitations Doing well with metoprolol. Will check CBC today.  -     CBC with Differential/Platelet; Future  Essential hypertension with goal blood pressure less than 130/80 BP poorly controlled. Changes were made in regimen today, added olmesartan 20 mg daily. Daily blood pressure log given with instructions on how to fill out and told to bring to next visit. [place] BP 130/80. Pt aware to report any persistent high or low readings. DASH diet and exercise encouraged. Exercise at least 150 minutes per  week and increase as tolerated. Goal BMI > 25. Stress management encouraged. Smoking cessation discussed. Avoid excessive alcohol. Avoid NSAID's. Avoid more than 2000 mg of sodium daily. Medications as prescribed. Follow up as scheduled in 2 weeks for recheck of BP and CMP. -     CMP14+EGFR; Future -     CBC with Differential/Platelet; Future -     olmesartan (BENICAR) 20 MG tablet; Take 1 tablet (20 mg total) by mouth daily.  Hypogonadism in male Pt to return for lab draw.  -     Testosterone,Free and Total; Future  Morbid obesity ([place]) Diet and exercise encouraged. Pt to return for lab draw.  -     CMP14+EGFR; Future -     CBC with Differential/Platelet; Future -     Lipid panel; Future -     Thyroid Panel With TSH; Future  Screening for colorectal cancer -     Cologuard     Continue all other maintenance medications.  Follow up plan: Return in about 2 weeks (around 05/20/2019), or if symptoms worsen or fail to improve, for HTN.  Continue healthy lifestyle choices, including diet (rich in fruits, vegetables, and lean proteins, and low in salt and simple carbohydrates) and exercise (at least 30 minutes of moderate physical activity daily).  Educational handout given for DASH diet  The above assessment and management plan was discussed with the patient. The patient verbalized understanding of and has agreed to the management plan. Patient is aware to call the clinic if symptoms persist or worsen. Patient is aware when to return to the clinic for a follow-up visit. Patient educated on when it is appropriate to go to the emergency department.   Monia Pouch, FNP-C [facility] [phone] 05/06/19

## 2019-05-06 NOTE — Patient Instructions (Signed)
DASH Eating Plan DASH stands for "Dietary Approaches to Stop Hypertension." The DASH eating plan is a healthy eating plan that has been shown to reduce high blood pressure (hypertension). Additional health benefits may include reducing the risk of type 2 diabetes mellitus, heart disease, and stroke. The DASH eating plan may also help with weight loss.  WHAT DO I NEED TO KNOW ABOUT THE DASH EATING PLAN? For the DASH eating plan, you will follow these general guidelines:  Choose foods with a percent daily value for sodium of less than 5% (as listed on the food label).  Use salt-free seasonings or herbs instead of table salt or sea salt.  Check with your health care provider or pharmacist before using salt substitutes.  Eat lower-sodium products, often labeled as "lower sodium" or "no salt added."  Eat fresh foods.  Eat more vegetables, fruits, and low-fat dairy products.  Choose whole grains. Look for the word "whole" as the first word in the ingredient list.  Choose fish and skinless chicken or turkey more often than red meat. Limit fish, poultry, and meat to 6 oz (170 g) each day.  Limit sweets, desserts, sugars, and sugary drinks.  Choose heart-healthy fats.  Limit cheese to 1 oz (28 g) per day.  Eat more home-cooked food and less restaurant, buffet, and fast food.  Limit fried foods.  Cook foods using methods other than frying.  Limit canned vegetables. If you do use them, rinse them well to decrease the sodium.  When eating at a restaurant, ask that your food be prepared with less salt, or no salt if possible.  WHAT FOODS CAN I EAT? Seek help from a dietitian for individual calorie needs.  Grains Whole grain or whole wheat bread. Brown rice. Whole grain or whole wheat pasta. Quinoa, bulgur, and whole grain cereals. Low-sodium cereals. Corn or whole wheat flour tortillas. Whole grain cornbread. Whole grain crackers. Low-sodium crackers.  Vegetables Fresh or frozen  vegetables (raw, steamed, roasted, or grilled). Low-sodium or reduced-sodium tomato and vegetable juices. Low-sodium or reduced-sodium tomato sauce and paste. Low-sodium or reduced-sodium canned vegetables.   Fruits All fresh, canned (in natural juice), or frozen fruits.  Meat and Other Protein Products Ground beef (85% or leaner), grass-fed beef, or beef trimmed of fat. Skinless chicken or turkey. Ground chicken or turkey. Pork trimmed of fat. All fish and seafood. Eggs. Dried beans, peas, or lentils. Unsalted nuts and seeds. Unsalted canned beans.  Dairy Low-fat dairy products, such as skim or 1% milk, 2% or reduced-fat cheeses, low-fat ricotta or cottage cheese, or plain low-fat yogurt. Low-sodium or reduced-sodium cheeses.  Fats and Oils Tub margarines without trans fats. Light or reduced-fat mayonnaise and salad dressings (reduced sodium). Avocado. Safflower, olive, or canola oils. Natural peanut or almond butter.  Other Unsalted popcorn and pretzels. The items listed above may not be a complete list of recommended foods or beverages. Contact your dietitian for more options.  WHAT FOODS ARE NOT RECOMMENDED?  Grains White bread. White pasta. White rice. Refined cornbread. Bagels and croissants. Crackers that contain trans fat.  Vegetables Creamed or fried vegetables. Vegetables in a cheese sauce. Regular canned vegetables. Regular canned tomato sauce and paste. Regular tomato and vegetable juices.  Fruits Dried fruits. Canned fruit in light or heavy syrup. Fruit juice.  Meat and Other Protein Products Fatty cuts of meat. Ribs, chicken wings, bacon, sausage, bologna, salami, chitterlings, fatback, hot dogs, bratwurst, and packaged luncheon meats. Salted nuts and seeds. Canned beans with salt.    Dairy Whole or 2% milk, cream, half-and-half, and cream cheese. Whole-fat or sweetened yogurt. Full-fat cheeses or blue cheese. Nondairy creamers and whipped toppings. Processed cheese,  cheese spreads, or cheese curds.  Condiments Onion and garlic salt, seasoned salt, table salt, and sea salt. Canned and packaged gravies. Worcestershire sauce. Tartar sauce. Barbecue sauce. Teriyaki sauce. Soy sauce, including reduced sodium. Steak sauce. Fish sauce. Oyster sauce. Cocktail sauce. Horseradish. Ketchup and mustard. Meat flavorings and tenderizers. Bouillon cubes. Hot sauce. Tabasco sauce. Marinades. Taco seasonings. Relishes.  Fats and Oils Butter, stick margarine, lard, shortening, ghee, and bacon fat. Coconut, palm kernel, or palm oils. Regular salad dressings.  Other Pickles and olives. Salted popcorn and pretzels.  The items listed above may not be a complete list of foods and beverages to avoid. Contact your dietitian for more information.  WHERE CAN I FIND MORE INFORMATION? National Heart, Lung, and [employer] Institute: www.nhlbi.nih.gov/health/health-topics/topics/dash/ Document Released: 08/30/2011 Document Revised: 01/25/2014 Document Reviewed: 07/15/2013 Exit[employer] Patient Information 2015 [employer]. This information is not intended to replace advice given to you by your health care provider. Make sure you discuss any questions you have with your health care provider.   I think that you would greatly benefit from seeing a nutritionist.  If you are interested, please call Dr Sykes at [phone] to schedule an appointment.   

## 2019-05-13 ENCOUNTER — Other Ambulatory Visit: Payer: Self-pay | Admitting: *Deleted

## 2019-05-13 MED ORDER — CLOTRIMAZOLE-BETAMETHASONE 1-0.05 % EX CREA: 1.0000 "application " | TOPICAL_CREAM | Freq: Two times a day (BID) | CUTANEOUS | 3 refills | Status: DC

## 2019-05-13 MED ORDER — METOPROLOL SUCCINATE ER 25 MG PO TB24: 25.0000 mg | ORAL_TABLET | Freq: Every day | ORAL | 1 refills | Status: DC

## 2019-05-26 ENCOUNTER — Ambulatory Visit: Payer: Managed Care, Other (non HMO) | Admitting: Family Medicine

## 2019-06-22 ENCOUNTER — Other Ambulatory Visit: Payer: Managed Care, Other (non HMO)

## 2019-06-22 ENCOUNTER — Other Ambulatory Visit: Payer: Self-pay

## 2019-06-22 DIAGNOSIS — Z7689 Persons encountering health services in other specified circumstances: Secondary | ICD-10-CM

## 2019-06-22 DIAGNOSIS — I10 Essential (primary) hypertension: Secondary | ICD-10-CM

## 2019-06-22 DIAGNOSIS — E291 Testicular hypofunction: Secondary | ICD-10-CM

## 2019-06-22 DIAGNOSIS — R002 Palpitations: Secondary | ICD-10-CM

## 2019-06-23 ENCOUNTER — Encounter: Payer: Self-pay | Admitting: Family Medicine

## 2019-06-23 ENCOUNTER — Ambulatory Visit (INDEPENDENT_AMBULATORY_CARE_PROVIDER_SITE_OTHER): Payer: Managed Care, Other (non HMO) | Admitting: Family Medicine

## 2019-06-23 VITALS — BP 143/86 | HR 92 | Temp 98.2°F | Resp 20 | Ht 72.0 in | Wt 377.0 lb

## 2019-06-23 DIAGNOSIS — Z1211 Encounter for screening for malignant neoplasm of colon: Secondary | ICD-10-CM

## 2019-06-23 DIAGNOSIS — Z0001 Encounter for general adult medical examination with abnormal findings: Secondary | ICD-10-CM

## 2019-06-23 DIAGNOSIS — I10 Essential (primary) hypertension: Secondary | ICD-10-CM

## 2019-06-23 DIAGNOSIS — E291 Testicular hypofunction: Secondary | ICD-10-CM

## 2019-06-23 DIAGNOSIS — Z23 Encounter for immunization: Secondary | ICD-10-CM | POA: Diagnosis not present

## 2019-06-23 DIAGNOSIS — Z1212 Encounter for screening for malignant neoplasm of rectum: Secondary | ICD-10-CM

## 2019-06-23 DIAGNOSIS — L719 Rosacea, unspecified: Secondary | ICD-10-CM

## 2019-06-23 DIAGNOSIS — E781 Pure hyperglyceridemia: Secondary | ICD-10-CM

## 2019-06-23 DIAGNOSIS — Z0000 Encounter for general adult medical examination without abnormal findings: Secondary | ICD-10-CM

## 2019-06-23 MED ORDER — METRONIDAZOLE 0.75 % EX GEL: 1.0000 "application " | Freq: Two times a day (BID) | CUTANEOUS | 0 refills | Status: DC

## 2019-06-23 MED ORDER — TESTOSTERONE ENANTHATE 75 MG/0.5ML ~~LOC~~ SOAJ: 75.0000 mg | SUBCUTANEOUS | 4 refills | Status: DC

## 2019-06-23 NOTE — Progress Notes (Signed)
Subjective:  Patient ID: Francisco Adams, male    DOB: 10-May-1971, 48 y.o.   MRN: [id number]  Patient Care Team: Baruch Gouty, FNP as PCP - General (Family Medicine)   Chief Complaint:  Annual Exam   HPI: Francisco Adams is a 48 y.o. male presenting on 06/23/2019 for Annual Exam   Pt presents today for his annual physical exam. Pt has continued redness to bilateral cheeks and now forehead. He has had a few episodes or red raised rash to forehead and cheeks that resolves after hydrocortisone cream. No fever, chills, wounds, insect bites, or changes in hygiene or household products. He states he still continues to have decreased libido and fatigue. States he feels his topical testosterone is ineffective. Last testosterone level low. He reports his blood pressure has been well controlled at home, no high or low readings. States 135/85 usually. No chest pain, leg swelling, palpitations, or headaches. Compliant with medications. Tries to watch diet, but difficult at times. No regular exercise routine.    Relevant past medical, surgical, family, and social history reviewed and updated as indicated.  Allergies and medications reviewed and updated. Date reviewed: Chart in Epic.   Past Medical History:  Diagnosis Date  . Arthritis   . Dermatitis    head  and face   . Palpitations   . Sleep apnea    cpap   . Testosterone deficiency     Past Surgical History:  Procedure Laterality Date  . ANKLE RECONSTRUCTION Right   . SPINE SURGERY     cervical - 3 disc fusions    Social History   Socioeconomic History  . Marital status: Divorced    Spouse name: Not on file  . Number of children: 3  . Years of education: Not on file  . Highest education level: Not on file  Occupational History  . Not on file  Social Needs  . Financial resource strain: Not on file  . Food insecurity    Worry: Not on file    Inability: Not on file  . Transportation needs    Medical: Not on file   Non-medical: Not on file  Tobacco Use  . Smoking status: Former Research scientist (life sciences)  . Smokeless tobacco: Current User    Types: Chew  Substance and Sexual Activity  . Alcohol use: Never    Frequency: Never  . Drug use: Never  . Sexual activity: Not on file  Lifestyle  . Physical activity    Days per week: Not on file    Minutes per session: Not on file  . Stress: Not on file  Relationships  . Social Herbalist on phone: Not on file    Gets together: Not on file    Attends religious service: Not on file    Active member of club or organization: Not on file    Attends meetings of clubs or organizations: Not on file    Relationship status: Not on file  . Intimate partner violence    Fear of current or ex partner: Not on file    Emotionally abused: Not on file    Physically abused: Not on file    Forced sexual activity: Not on file  Other Topics Concern  . Not on file  Social History Narrative  . Not on file    Outpatient Encounter Medications as of 06/23/2019  Medication Sig  . clotrimazole-betamethasone (LOTRISONE) cream Apply 1 application topically 2 (two) times daily.  Marland Kitchen  fexofenadine (ALLEGRA) 180 MG tablet Take 180 mg by mouth daily.  . metoprolol succinate (TOPROL-XL) 25 MG 24 hr tablet Take 1 tablet (25 mg total) by mouth daily.  Marland Kitchen olmesartan (BENICAR) 20 MG tablet Take 1 tablet (20 mg total) by mouth daily.  . [DISCONTINUED] Testosterone 20.25 MG/ACT (1.62%) GEL Apply 2 Pump topically daily.  . metroNIDAZOLE (METROGEL) 0.75 % gel Apply 1 application topically 2 (two) times daily.  . Testosterone Enanthate 75 MG/0.5ML SOAJ Inject 75 mg into the muscle once a week.   No facility-administered encounter medications on file as of 06/23/2019.     No Known Allergies  Review of Systems  Constitutional: Positive for fatigue. Negative for activity change, appetite change, chills, diaphoresis, fever and unexpected weight change.  HENT: Negative.   Eyes: Negative.  Negative  for photophobia and visual disturbance.  Respiratory: Negative for cough, chest tightness and shortness of breath.   Cardiovascular: Negative for chest pain, palpitations and leg swelling.  Gastrointestinal: Negative for abdominal distention, abdominal pain, anal bleeding, blood in stool, constipation, diarrhea, nausea, rectal pain and vomiting.  Endocrine: Negative.  Negative for cold intolerance, heat intolerance, polydipsia, polyphagia and polyuria.  Genitourinary: Negative for decreased urine volume, difficulty urinating, discharge, dysuria, flank pain, frequency, penile pain, penile swelling, scrotal swelling, testicular pain and urgency.       Decreased libido  Musculoskeletal: Negative for arthralgias, back pain and myalgias.  Skin: Positive for color change and rash. Negative for pallor and wound.  Allergic/Immunologic: Negative.   Neurological: Negative for dizziness, tremors, seizures, syncope, facial asymmetry, speech difficulty, weakness, light-headedness, numbness and headaches.  Hematological: Negative.   Psychiatric/Behavioral: Negative for agitation, confusion, hallucinations, sleep disturbance and suicidal ideas.  All other systems reviewed and are negative.       Objective:  BP (!) 143/86 (BP Location: Right Arm, Cuff Size: Large)   Pulse 92   Temp 98.2 F (36.8 C)   Resp 20   Ht 6' (1.829 m)   Wt (!) 377 lb (171 kg)   SpO2 95%   BMI 51.13 kg/m    Wt Readings from Last 3 Encounters:  06/23/19 (!) 377 lb (171 kg)  05/06/19 (!) 373 lb (169.2 kg)    Physical Exam Vitals signs and nursing note reviewed.  Constitutional:      General: He is not in acute distress.    Appearance: Normal appearance. He is well-developed and well-groomed. He is morbidly obese. He is not ill-appearing, toxic-appearing or diaphoretic.  HENT:     Head: Normocephalic and atraumatic.     Jaw: There is normal jaw occlusion.     Right Ear: Hearing normal.     Left Ear: Hearing normal.      Nose: Nose normal.     Mouth/Throat:     Lips: Pink.     Mouth: Mucous membranes are moist.     Pharynx: Oropharynx is clear. Uvula midline.  Eyes:     General: Lids are normal.     Extraocular Movements: Extraocular movements intact.     Conjunctiva/sclera: Conjunctivae normal.     Pupils: Pupils are equal, round, and reactive to light.  Neck:     Musculoskeletal: Normal range of motion and neck supple.     Thyroid: No thyroid mass, thyromegaly or thyroid tenderness.     Vascular: No carotid bruit or JVD.     Trachea: Trachea and phonation normal.  Cardiovascular:     Rate and Rhythm: Normal rate and regular rhythm.  Chest Wall: PMI is not displaced.     Pulses: Normal pulses.     Heart sounds: Normal heart sounds. No murmur. No friction rub. No gallop.   Pulmonary:     Effort: Pulmonary effort is normal. No respiratory distress.     Breath sounds: Normal breath sounds. No wheezing.  Abdominal:     General: Abdomen is protuberant. Bowel sounds are normal. There is no distension or abdominal bruit.     Palpations: Abdomen is soft. There is no hepatomegaly or splenomegaly.     Tenderness: There is no abdominal tenderness. There is no right CVA tenderness or left CVA tenderness.     Hernia: No hernia is present.  Musculoskeletal: Normal range of motion.     Right lower leg: No edema.     Left lower leg: No edema.  Lymphadenopathy:     Cervical: No cervical adenopathy.  Skin:    General: Skin is warm and dry.     Capillary Refill: Capillary refill takes less than 2 seconds.     Coloration: Skin is not cyanotic, jaundiced or pale.     Findings: Rash present.     Comments: Centrofacial redness with telangiectasias. Scattered red papules to forehead.   Neurological:     General: No focal deficit present.     Mental Status: He is alert and oriented to person, place, and time.     Cranial Nerves: Cranial nerves are intact. No cranial nerve deficit.     Sensory: Sensation is  intact. No sensory deficit.     Motor: Motor function is intact. No weakness.     Coordination: Coordination is intact. Coordination normal.     Gait: Gait is intact. Gait normal.     Deep Tendon Reflexes: Reflexes are normal and symmetric. Reflexes normal.  Psychiatric:        Attention and Perception: Attention and perception normal.        Mood and Affect: Mood and affect normal.        Speech: Speech normal.        Behavior: Behavior normal. Behavior is cooperative.        Thought Content: Thought content normal.        Cognition and Memory: Cognition and memory normal.        Judgment: Judgment normal.     Results for orders placed or performed in visit on 06/22/19  Testosterone,Free and Total  Result Value Ref Range   Testosterone 193 (L) 264 - 916 ng/dL   Testosterone, Free WILL FOLLOW   Thyroid Panel With TSH  Result Value Ref Range   TSH 1.980 0.450 - 4.500 uIU/mL   T4, Total 6.6 4.5 - 12.0 ug/dL   T3 Uptake Ratio 21 (L) 24 - 39 %   Free Thyroxine Index 1.4 1.2 - 4.9  Lipid panel  Result Value Ref Range   Cholesterol, Total 166 100 - 199 mg/dL   Triglycerides 203 (H) 0 - 149 mg/dL   HDL 36 (L) >39 mg/dL   VLDL Cholesterol Cal 35 5 - 40 mg/dL   LDL Chol Calc (NIH) 95 0 - 99 mg/dL   Chol/HDL Ratio 4.6 0.0 - 5.0 ratio  CBC with Differential/Platelet  Result Value Ref Range   WBC 7.6 3.4 - 10.8 x10E3/uL   RBC 5.27 4.14 - 5.80 x10E6/uL   Hemoglobin 15.0 13.0 - 17.7 g/dL   Hematocrit 43.8 37.5 - 51.0 %   MCV 83 79 - 97 fL   MCH 28.5  26.6 - 33.0 pg   MCHC 34.2 31.5 - 35.7 g/dL   RDW 13.2 11.6 - 15.4 %   Platelets 247 150 - 450 x10E3/uL   Neutrophils 59 Not Estab. %   Lymphs 29 Not Estab. %   Monocytes 8 Not Estab. %   Eos 2 Not Estab. %   Basos 1 Not Estab. %   Neutrophils Absolute 4.5 1.4 - 7.0 x10E3/uL   Lymphocytes Absolute 2.2 0.7 - 3.1 x10E3/uL   Monocytes Absolute 0.6 0.1 - 0.9 x10E3/uL   EOS (ABSOLUTE) 0.2 0.0 - 0.4 x10E3/uL   Basophils Absolute 0.0 0.0  - 0.2 x10E3/uL   Immature Granulocytes 1 Not Estab. %   Immature Grans (Abs) 0.1 0.0 - 0.1 x10E3/uL  CMP14+EGFR  Result Value Ref Range   Glucose 116 (H) 65 - 99 mg/dL   BUN 17 6 - 24 mg/dL   Creatinine, Ser 0.86 0.76 - 1.27 mg/dL   GFR calc non Af Amer 101 >59 mL/min/1.73   GFR calc Af Amer 117 >59 mL/min/1.73   BUN/Creatinine Ratio 20 9 - 20   Sodium 139 134 - 144 mmol/L   Potassium 4.4 3.5 - 5.2 mmol/L   Chloride 104 96 - 106 mmol/L   CO2 23 20 - 29 mmol/L   Calcium 9.0 8.7 - 10.2 mg/dL   Total Protein 6.7 6.0 - 8.5 g/dL   Albumin 4.1 4.0 - 5.0 g/dL   Globulin, Total 2.6 1.5 - 4.5 g/dL   Albumin/Globulin Ratio 1.6 1.2 - 2.2   Bilirubin Total 0.2 0.0 - 1.2 mg/dL   Alkaline Phosphatase 68 39 - 117 IU/L   AST 16 0 - 40 IU/L   ALT 26 0 - 44 IU/L       Pertinent labs & imaging results that were available during my care of the patient were reviewed by me and considered in my medical decision making.  Assessment & Plan:  Francisco Adams was seen today for annual exam.  Diagnoses and all orders for this visit:  Annual physical exam Health maintenance discussed. Labs completed on 06/22/2019. Diet and exercise encouraged.  -     Cologuard  Morbid obesity ([place]) Diet and exercise encouraged.   Hypogonadism in male Has been on topical therapy for several years, testosterone levels remain low. Will start weekly IM testosterone repletion therapy. Pt will come in for nurse visit for first injection.  -     Testosterone Enanthate 75 MG/0.5ML SOAJ; Inject 75 mg into the muscle once a week.  Essential hypertension with goal blood pressure less than 130/80 BP fairly controlled. Changes were not made in regimen today. Daily blood pressure log given with instructions on how to fill out and told to bring to next visit. Goal BP 130/80. Pt aware to report any persistent high or low readings. DASH diet and exercise encouraged. Exercise at least 150 minutes per week and increase as tolerated. Goal BMI  > 25. Stress management encouraged. Smoking cessation discussed. Avoid excessive alcohol. Avoid NSAID's. Avoid more than 2000 mg of sodium daily. Medications as prescribed. Follow up as scheduled.   Rosacea Symptomatic care discussed. Will trial below to see if beneficial. If persistent, will refer to dermatology.  -     metroNIDAZOLE (METROGEL) 0.75 % gel; Apply 1 application topically 2 (two) times daily.  Hypertriglyceridemia Diet and exercise encouraged. Will recheck levels in 6 months. Will initiate therapy if warranted.   Screening for colorectal cancer Not high risk.  -     Cologuard  influenza vaccine given today.   Continue all other maintenance medications.  Follow up plan: Return in about 6 weeks (around 08/04/2019) for testosterone.  Continue healthy lifestyle choices, including diet (rich in fruits, vegetables, and lean proteins, and low in salt and simple carbohydrates) and exercise (at least 30 minutes of moderate physical activity daily).  Educational handout given for Rosacea   The above assessment and management plan was discussed with the patient. The patient verbalized understanding of and has agreed to the management plan. Patient is aware to call the clinic if they develop any new symptoms or if symptoms persist or worsen. Patient is aware when to return to the clinic for a follow-up visit. Patient educated on when it is appropriate to go to the emergency department.   Monia Pouch, FNP-C [facility] [phone]

## 2019-06-23 NOTE — Patient Instructions (Signed)
Rosacea Rosacea is a long-term (chronic) condition that affects the skin of the face, including the cheeks, nose, forehead, and chin. This condition can also affect the eyes. Rosacea causes blood vessels near the surface of the skin to get bigger (be enlarged), and that makes the skin red. What are the causes? The cause of this condition is not known. Certain things can make rosacea worse, including:  Hot baths.  Exercise.  Sunlight.  Very hot or cold temperatures.  Hot or spicy foods and drinks.  Drinking alcohol.  Stress.  Taking blood pressure medicine.  Long-term use of topical steroids on the face. What increases the risk? You are more likely to get this condition if you:  Are older than 50 years of age.  Are a woman.  Have light-colored skin (light complexion).  Have a family history of the condition. What are the signs or symptoms?   Redness of the face.  Red bumps or pimples on the face.  A red, enlarged nose.  Blushing easily.  Red lines on the skin.  Irritated, burning, or itchy feeling in the eyes.  Swollen eyelids.  Drainage from the eyes.  Feeling like there is something in your eye. How is this treated? There is no cure for this condition, but treatment can help to control your symptoms. Your doctor may suggest that you see a skin specialist (dermatologist). Treatment may include:  Medicines that are put on the skin or taken by mouth (orally).  Laser treatment to improve how the skin looks.  Surgery. This is rare. Your doctor will also suggest the best way to take care of your skin. Even after your skin gets better, you will likely need to continue treatment to keep your rosacea from coming back. Follow these instructions at home: Skin care Take care of your skin as told by your doctor. Your doctor may tell you to do these things:  Wash your skin gently two or more times each day.  Use mild soap.  Use a sunscreen or sunblock with SPF  30 or greater.  Use gentle cosmetics that are meant for sensitive skin.  Shave with an electric shaver instead of a blade. Lifestyle  Try to keep track of what foods make this condition worse. Avoid those foods. These may include: ? Spicy foods. ? Seafood. ? Cheese. ? Hot liquids. ? Nuts. ? Chocolate. ? Iodized salt.  Do not drink alcohol.  Avoid very cold or hot temperatures.  Try to reduce your stress. If you need help to do this, talk with your doctor.  When you exercise, do these things to stay cool: ? Limit sun exposure to your face. ? Use a fan. ? Exercise for a shorter time, and exercise more often. General instructions  Take and apply over-the-counter and prescription medicines only as told by your doctor.  If you were prescribed an antibiotic medicine, apply it or take it as told by your doctor. Do not stop using the antibiotic even if your condition improves.  If your eyelids are affected, hold warm compresses on them. Do this as told by your doctor.  Keep all follow-up visits as told by your doctor. This is important. Contact a doctor if:  Your symptoms get worse.  Your symptoms do not improve after 2 months of treatment.  You have new symptoms.  You have any changes in how you see (vision) or you have problems with your eyes, such as redness or itching.  You feel very sad (depressed).    You do not want to eat as much as normal (lose your appetite).  You have trouble focusing your mind (concentrating). Summary  Rosacea is a long-term condition that affects the skin of the face, including the cheeks, nose, forehead, and chin.  Take care of your skin as told by your doctor.  Take and apply medicines only as told by your doctor.  Contact a doctor if your symptoms get worse or if you have problems with your eyes. This information is not intended to replace advice given to you by your health care provider. Make sure you discuss any questions you have  with your health care provider. Document Released: 12/03/2011 Document Revised: 02/12/2018 Document Reviewed: 02/12/2018 Elsevier Patient Education  2020 [employer].

## 2019-06-24 ENCOUNTER — Telehealth: Payer: Self-pay | Admitting: *Deleted

## 2019-06-24 LAB — CBC WITH DIFFERENTIAL/PLATELET
Basophils Absolute: 0 10*3/uL (ref 0.0–0.2)
Basos: 1 %
EOS (ABSOLUTE): 0.2 10*3/uL (ref 0.0–0.4)
Eos: 2 %
Hematocrit: 43.8 % (ref 37.5–51.0)
Hemoglobin: 15 g/dL (ref 13.0–17.7)
Immature Grans (Abs): 0.1 10*3/uL (ref 0.0–0.1)
Immature Granulocytes: 1 %
Lymphocytes Absolute: 2.2 10*3/uL (ref 0.7–3.1)
Lymphs: 29 %
MCH: 28.5 pg (ref 26.6–33.0)
MCHC: 34.2 g/dL (ref 31.5–35.7)
MCV: 83 fL (ref 79–97)
Monocytes Absolute: 0.6 10*3/uL (ref 0.1–0.9)
Monocytes: 8 %
Neutrophils Absolute: 4.5 10*3/uL (ref 1.4–7.0)
Neutrophils: 59 %
Platelets: 247 10*3/uL (ref 150–450)
RBC: 5.27 x10E6/uL (ref 4.14–5.80)
RDW: 13.2 % (ref 11.6–15.4)
WBC: 7.6 10*3/uL (ref 3.4–10.8)

## 2019-06-24 LAB — CMP14+EGFR
ALT: 26 IU/L (ref 0–44)
AST: 16 IU/L (ref 0–40)
Albumin/Globulin Ratio: 1.6 (ref 1.2–2.2)
Albumin: 4.1 g/dL (ref 4.0–5.0)
Alkaline Phosphatase: 68 IU/L (ref 39–117)
BUN/Creatinine Ratio: 20 (ref 9–20)
BUN: 17 mg/dL (ref 6–24)
Bilirubin Total: 0.2 mg/dL (ref 0.0–1.2)
CO2: 23 mmol/L (ref 20–29)
Calcium: 9 mg/dL (ref 8.7–10.2)
Chloride: 104 mmol/L (ref 96–106)
Creatinine, Ser: 0.86 mg/dL (ref 0.76–1.27)
GFR calc Af Amer: 117 mL/min/{1.73_m2} (ref >59)
GFR calc non Af Amer: 101 mL/min/{1.73_m2} (ref >59)
Globulin, Total: 2.6 g/dL (ref 1.5–4.5)
Glucose: 116 mg/dL — ABNORMAL HIGH (ref 65–99)
Potassium: 4.4 mmol/L (ref 3.5–5.2)
Sodium: 139 mmol/L (ref 134–144)
Total Protein: 6.7 g/dL (ref 6.0–8.5)

## 2019-06-24 LAB — LIPID PANEL
Chol/HDL Ratio: 4.6 ratio (ref 0.0–5.0)
Cholesterol, Total: 166 mg/dL (ref 100–199)
HDL: 36 mg/dL — ABNORMAL LOW (ref >39)
LDL Chol Calc (NIH): 95 mg/dL (ref 0–99)
Triglycerides: 203 mg/dL — ABNORMAL HIGH (ref 0–149)
VLDL Cholesterol Cal: 35 mg/dL (ref 5–40)

## 2019-06-24 LAB — THYROID PANEL WITH TSH
Free Thyroxine Index: 1.4 (ref 1.2–4.9)
T3 Uptake Ratio: 21 % — ABNORMAL LOW (ref 24–39)
T4, Total: 6.6 ug/dL (ref 4.5–12.0)
TSH: 1.98 u[IU]/mL (ref 0.450–4.500)

## 2019-06-24 LAB — TESTOSTERONE,FREE AND TOTAL
Testosterone, Free: 9.2 pg/mL (ref 7.2–24.0)
Testosterone: 193 ng/dL — ABNORMAL LOW (ref 264–916)

## 2019-06-24 NOTE — Telephone Encounter (Signed)
Received fax from [place] stating patient was previously on Testosterone 200mg /ml 1.62%.  Does dose need to be changed to 75mg /0.42ml?

## 2019-06-24 NOTE — Telephone Encounter (Signed)
Pharmacist aware of med change

## 2019-06-24 NOTE — Telephone Encounter (Signed)
Yes, switched to IM instead of topical

## 2019-07-01 ENCOUNTER — Ambulatory Visit: Payer: Managed Care, Other (non HMO)

## 2019-07-31 ENCOUNTER — Other Ambulatory Visit: Payer: Self-pay

## 2019-08-03 ENCOUNTER — Other Ambulatory Visit: Payer: Self-pay

## 2019-08-03 ENCOUNTER — Ambulatory Visit: Payer: Managed Care, Other (non HMO) | Admitting: Family Medicine

## 2019-08-03 ENCOUNTER — Encounter: Payer: Self-pay | Admitting: Family Medicine

## 2019-08-03 VITALS — BP 138/82 | HR 82 | Temp 98.6°F | Resp 20 | Ht 72.0 in | Wt 377.0 lb

## 2019-08-03 DIAGNOSIS — E291 Testicular hypofunction: Secondary | ICD-10-CM

## 2019-08-03 DIAGNOSIS — L719 Rosacea, unspecified: Secondary | ICD-10-CM | POA: Diagnosis not present

## 2019-08-03 DIAGNOSIS — I10 Essential (primary) hypertension: Secondary | ICD-10-CM

## 2019-08-03 DIAGNOSIS — N529 Male erectile dysfunction, unspecified: Secondary | ICD-10-CM

## 2019-08-03 MED ORDER — CLOTRIMAZOLE-BETAMETHASONE 1-0.05 % EX CREA: 1.0000 "application " | TOPICAL_CREAM | Freq: Two times a day (BID) | CUTANEOUS | 3 refills | Status: DC

## 2019-08-03 MED ORDER — TESTOSTERONE ENANTHATE 75 MG/0.5ML ~~LOC~~ SOAJ: 100.0000 mg | SUBCUTANEOUS | 4 refills | Status: DC

## 2019-08-03 MED ORDER — OLMESARTAN MEDOXOMIL 20 MG PO TABS: 20.0000 mg | ORAL_TABLET | Freq: Every day | ORAL | 1 refills | Status: DC

## 2019-08-03 MED ORDER — SILDENAFIL CITRATE 100 MG PO TABS: 100.0000 mg | ORAL_TABLET | Freq: Every day | ORAL | 0 refills | Status: DC | PRN

## 2019-08-03 NOTE — Progress Notes (Signed)
Subjective:  Patient ID: Francisco Adams, male    DOB: 1971-04-21, 48 y.o.   MRN: [id number]  Patient Care Team: Baruch Gouty, FNP as PCP - General (Family Medicine)   Chief Complaint:  Medical Management of Chronic Issues (6 wk and labs) and Hypertension   HPI: Francisco Adams is a 48 y.o. male presenting on 08/03/2019 for Medical Management of Chronic Issues (6 wk and labs) and Hypertension   1. Hypogonadism in male Started testosterone therapy 4 weeks ago. States he has not noticed a difference in mood, libido, energy, or overall wellbeing. States he is taking the injections every Friday without complications.   2. Essential hypertension with goal blood pressure less than 130/80 Checking BP and it is ranging around 130/75 at home. No chest pain, shortness of breath, palpitations, dizziness, headaches, or confusion. Does have lower extremity swelling, this is ongoing for him. States worse after working all day. Does wear compression socks with some relief of symptoms.   3. Morbid obesity ([place]) Does not exercise on a regular basis. Does watch diet at times, but not always.   4. Rosacea Metrogel did not help with symptoms. States the Lotrisone cream was more beneficial and would like to try this again.   5. Primary erectile dysfunction States he is out of his Viagra. States he uses this on an as needed basis. States it works well without associated side effects or sustained erection.      Relevant past medical, surgical, family, and social history reviewed and updated as indicated.  Allergies and medications reviewed and updated. Date reviewed: Chart in Epic.   Past Medical History:  Diagnosis Date  . Arthritis   . Dermatitis    head  and face   . Palpitations   . Sleep apnea    cpap   . Testosterone deficiency     Past Surgical History:  Procedure Laterality Date  . ANKLE RECONSTRUCTION Right   . SPINE SURGERY     cervical - 3 disc fusions    Social History   Socioeconomic History  . Marital status: Divorced    Spouse name: Not on file  . Number of children: 3  . Years of education: Not on file  . Highest education level: Not on file  Occupational History  . Not on file  Social Needs  . Financial resource strain: Not on file  . Food insecurity    Worry: Not on file    Inability: Not on file  . Transportation needs    Medical: Not on file    Non-medical: Not on file  Tobacco Use  . Smoking status: Former Research scientist (life sciences)  . Smokeless tobacco: Current User    Types: Chew  Substance and Sexual Activity  . Alcohol use: Never    Frequency: Never  . Drug use: Never  . Sexual activity: Not on file  Lifestyle  . Physical activity    Days per week: Not on file    Minutes per session: Not on file  . Stress: Not on file  Relationships  . Social Herbalist on phone: Not on file    Gets together: Not on file    Attends religious service: Not on file    Active member of club or organization: Not on file    Attends meetings of clubs or organizations: Not on file    Relationship status: Not on file  . Intimate partner violence    Fear of  current or ex partner: Not on file    Emotionally abused: Not on file    Physically abused: Not on file    Forced sexual activity: Not on file  Other Topics Concern  . Not on file  Social History Narrative  . Not on file    Outpatient Encounter Medications as of 08/03/2019  Medication Sig  . clotrimazole-betamethasone (LOTRISONE) cream Apply 1 application topically 2 (two) times daily.  . fexofenadine (ALLEGRA) 180 MG tablet Take 180 mg by mouth daily.  . metoprolol succinate (TOPROL-XL) 25 MG 24 hr tablet Take 1 tablet (25 mg total) by mouth daily.  Marland Kitchen olmesartan (BENICAR) 20 MG tablet Take 1 tablet (20 mg total) by mouth daily.  . Testosterone Enanthate 75 MG/0.5ML SOAJ Inject 100 mg into the muscle once a week.  . [DISCONTINUED] clotrimazole-betamethasone (LOTRISONE) cream Apply 1 application  topically 2 (two) times daily.  . [DISCONTINUED] olmesartan (BENICAR) 20 MG tablet Take 1 tablet (20 mg total) by mouth daily.  . [DISCONTINUED] Testosterone Enanthate 75 MG/0.5ML SOAJ Inject 75 mg into the muscle once a week.  . sildenafil (VIAGRA) 100 MG tablet Take 1 tablet (100 mg total) by mouth daily as needed for erectile dysfunction.  . [DISCONTINUED] metroNIDAZOLE (METROGEL) 0.75 % gel Apply 1 application topically 2 (two) times daily. (Patient not taking: Reported on 08/03/2019)   No facility-administered encounter medications on file as of 08/03/2019.     No Known Allergies  Review of Systems  Constitutional: Positive for fatigue. Negative for activity change, appetite change, chills, diaphoresis, fever and unexpected weight change.  HENT: Negative.   Eyes: Negative.  Negative for photophobia and visual disturbance.  Respiratory: Negative for cough, chest tightness and shortness of breath.   Cardiovascular: Positive for leg swelling. Negative for chest pain and palpitations.  Gastrointestinal: Negative for abdominal distention, abdominal pain, anal bleeding, blood in stool, constipation, diarrhea, nausea, rectal pain and vomiting.  Endocrine: Negative.  Negative for cold intolerance, heat intolerance, polydipsia, polyphagia and polyuria.  Genitourinary: Negative for decreased urine volume, difficulty urinating, discharge, dysuria, flank pain, frequency, hematuria, penile pain, penile swelling, scrotal swelling, testicular pain and urgency.       Decreased libido, erectile dysfunction  Musculoskeletal: Negative for arthralgias and myalgias.  Skin: Negative.   Allergic/Immunologic: Negative.   Neurological: Negative for dizziness, tremors, seizures, syncope, facial asymmetry, speech difficulty, weakness, light-headedness, numbness and headaches.  Hematological: Negative.  Does not bruise/bleed easily.  Psychiatric/Behavioral: Negative for confusion, decreased concentration,  hallucinations, sleep disturbance and suicidal ideas.  All other systems reviewed and are negative.       Objective:  BP 138/82   Pulse 82   Temp 98.6 F (37 C)   Resp 20   Ht 6' (1.829 m)   Wt (!) 377 lb (171 kg)   SpO2 96%   BMI 51.13 kg/m    Wt Readings from Last 3 Encounters:  08/03/19 (!) 377 lb (171 kg)  06/23/19 (!) 377 lb (171 kg)  05/06/19 (!) 373 lb (169.2 kg)    Physical Exam Vitals signs and nursing note reviewed.  Constitutional:      General: He is not in acute distress.    Appearance: Normal appearance. He is well-developed and well-groomed. He is morbidly obese. He is not ill-appearing, toxic-appearing or diaphoretic.  HENT:     Head: Normocephalic and atraumatic.     Jaw: There is normal jaw occlusion.     Right Ear: Hearing, tympanic membrane, ear canal and external ear normal.  Left Ear: Hearing, tympanic membrane, ear canal and external ear normal.     Nose: Nose normal.     Mouth/Throat:     Lips: Pink.     Mouth: Mucous membranes are moist.     Pharynx: Oropharynx is clear. Uvula midline.  Eyes:     General: Lids are normal.     Extraocular Movements: Extraocular movements intact.     Conjunctiva/sclera: Conjunctivae normal.     Pupils: Pupils are equal, round, and reactive to light.  Neck:     Musculoskeletal: Normal range of motion and neck supple.     Thyroid: No thyroid mass, thyromegaly or thyroid tenderness.     Vascular: No carotid bruit or JVD.     Trachea: Trachea and phonation normal.  Cardiovascular:     Rate and Rhythm: Normal rate and regular rhythm.     Chest Wall: PMI is not displaced.     Pulses: Normal pulses.     Heart sounds: Normal heart sounds. No murmur. No friction rub. No gallop.   Pulmonary:     Effort: Pulmonary effort is normal. No respiratory distress.     Breath sounds: Normal breath sounds. No wheezing.  Abdominal:     General: Bowel sounds are normal. There is no distension or abdominal bruit.      Palpations: Abdomen is soft. There is no hepatomegaly or splenomegaly.     Tenderness: There is no abdominal tenderness. There is no right CVA tenderness or left CVA tenderness.     Hernia: No hernia is present.  Musculoskeletal: Normal range of motion.     Right lower leg: 2+ Edema present.     Left lower leg: 2+ Edema present.  Lymphadenopathy:     Cervical: No cervical adenopathy.  Skin:    General: Skin is warm and dry.     Capillary Refill: Capillary refill takes less than 2 seconds.     Coloration: Skin is not cyanotic, jaundiced or pale.     Findings: Rash present.     Comments: Redness to bilateral cheeks, chin, and nose. Rough texture with dryness around nasal folds.   Neurological:     General: No focal deficit present.     Mental Status: He is alert and oriented to person, place, and time.     Cranial Nerves: Cranial nerves are intact. No cranial nerve deficit.     Sensory: Sensation is intact. No sensory deficit.     Motor: Motor function is intact. No weakness.     Coordination: Coordination is intact. Coordination normal.     Gait: Gait is intact. Gait normal.     Deep Tendon Reflexes: Reflexes are normal and symmetric. Reflexes normal.  Psychiatric:        Attention and Perception: Attention and perception normal.        Mood and Affect: Mood and affect normal.        Speech: Speech normal.        Behavior: Behavior normal. Behavior is cooperative.        Thought Content: Thought content normal.        Cognition and Memory: Cognition and memory normal.        Judgment: Judgment normal.     Results for orders placed or performed in visit on 06/22/19  Testosterone,Free and Total  Result Value Ref Range   Testosterone 193 (L) 264 - 916 ng/dL   Testosterone, Free 9.2 7.2 - 24.0 pg/mL  Thyroid Panel With TSH  Result  Value Ref Range   TSH 1.980 0.450 - 4.500 uIU/mL   T4, Total 6.6 4.5 - 12.0 ug/dL   T3 Uptake Ratio 21 (L) 24 - 39 %   Free Thyroxine Index 1.4 1.2 -  4.9  Lipid panel  Result Value Ref Range   Cholesterol, Total 166 100 - 199 mg/dL   Triglycerides 203 (H) 0 - 149 mg/dL   HDL 36 (L) >39 mg/dL   VLDL Cholesterol Cal 35 5 - 40 mg/dL   LDL Chol Calc (NIH) 95 0 - 99 mg/dL   Chol/HDL Ratio 4.6 0.0 - 5.0 ratio  CBC with Differential/Platelet  Result Value Ref Range   WBC 7.6 3.4 - 10.8 x10E3/uL   RBC 5.27 4.14 - 5.80 x10E6/uL   Hemoglobin 15.0 13.0 - 17.7 g/dL   Hematocrit 43.8 37.5 - 51.0 %   MCV 83 79 - 97 fL   MCH 28.5 26.6 - 33.0 pg   MCHC 34.2 31.5 - 35.7 g/dL   RDW 13.2 11.6 - 15.4 %   Platelets 247 150 - 450 x10E3/uL   Neutrophils 59 Not Estab. %   Lymphs 29 Not Estab. %   Monocytes 8 Not Estab. %   Eos 2 Not Estab. %   Basos 1 Not Estab. %   Neutrophils Absolute 4.5 1.4 - 7.0 x10E3/uL   Lymphocytes Absolute 2.2 0.7 - 3.1 x10E3/uL   Monocytes Absolute 0.6 0.1 - 0.9 x10E3/uL   EOS (ABSOLUTE) 0.2 0.0 - 0.4 x10E3/uL   Basophils Absolute 0.0 0.0 - 0.2 x10E3/uL   Immature Granulocytes 1 Not Estab. %   Immature Grans (Abs) 0.1 0.0 - 0.1 x10E3/uL  CMP14+EGFR  Result Value Ref Range   Glucose 116 (H) 65 - 99 mg/dL   BUN 17 6 - 24 mg/dL   Creatinine, Ser 0.86 0.76 - 1.27 mg/dL   GFR calc non Af Amer 101 >59 mL/min/1.73   GFR calc Af Amer 117 >59 mL/min/1.73   BUN/Creatinine Ratio 20 9 - 20   Sodium 139 134 - 144 mmol/L   Potassium 4.4 3.5 - 5.2 mmol/L   Chloride 104 96 - 106 mmol/L   CO2 23 20 - 29 mmol/L   Calcium 9.0 8.7 - 10.2 mg/dL   Total Protein 6.7 6.0 - 8.5 g/dL   Albumin 4.1 4.0 - 5.0 g/dL   Globulin, Total 2.6 1.5 - 4.5 g/dL   Albumin/Globulin Ratio 1.6 1.2 - 2.2   Bilirubin Total 0.2 0.0 - 1.2 mg/dL   Alkaline Phosphatase 68 39 - 117 IU/L   AST 16 0 - 40 IU/L   ALT 26 0 - 44 IU/L       Pertinent labs & imaging results that were available during my care of the patient were reviewed by me and considered in my medical decision making.  Assessment & Plan:  Beckham was seen today for medical management of  chronic issues and hypertension.  Diagnoses and all orders for this visit:  Hypogonadism in male Will increase testosterone to 100 mg IM weekly, last testosterone was 196. Has not noticed a change with current dose. Will increase and recheck in 3 months.  -     Testosterone,Free and Total -     sildenafil (VIAGRA) 100 MG tablet; Take 1 tablet (100 mg total) by mouth daily as needed for erectile dysfunction. -     CMP14+EGFR -     CBC with Differential/Platelet -     Testosterone Enanthate 75 MG/0.5ML SOAJ; Inject 100 mg into  the muscle once a week.   Essential hypertension with goal blood pressure less than 130/80 BP fairly controlled. Changes were not made in regimen today. Goal BP is 130/80. Pt aware to report any persistent high or low readings. DASH diet and exercise encouraged. Exercise at least 150 minutes per week and increase as tolerated. Goal BMI > 25. Stress management encouraged. Avoid nicotine and tobacco product use. Avoid excessive alcohol and NSAID's. Avoid more than 2000 mg of sodium daily. Medications as prescribed. Follow up as scheduled.  -     olmesartan (BENICAR) 20 MG tablet; Take 1 tablet (20 mg total) by mouth daily. -     CMP14+EGFR -     CBC with Differential/Platelet  Morbid obesity ([facility]) Diet and exercise encouraged. Labs pending.  -     CMP14+EGFR -     CBC with Differential/Platelet  Rosacea Metrogel was not beneficial. Will restart below. If persistent, will refer to dermatology.  -     clotrimazole-betamethasone (LOTRISONE) cream; Apply 1 application topically 2 (two) times daily.  Primary erectile dysfunction Continue below as needed. Educated on need for emergent evaluation if prolonged erection.  -     sildenafil (VIAGRA) 100 MG tablet; Take 1 tablet (100 mg total) by mouth daily as needed for erectile dysfunction.     Continue all other maintenance medications.  Follow up plan: Return in about 3 months (around 11/03/2019), or if symptoms worsen  or fail to improve, for HTN.  Continue healthy lifestyle choices, including diet (rich in fruits, vegetables, and lean proteins, and low in salt and simple carbohydrates) and exercise (at least 30 minutes of moderate physical activity daily).  Educational handout given for testosterone injection   The above assessment and management plan was discussed with the patient. The patient verbalized understanding of and has agreed to the management plan. Patient is aware to call the clinic if they develop any new symptoms or if symptoms persist or worsen. Patient is aware when to return to the clinic for a follow-up visit. Patient educated on when it is appropriate to go to the emergency department.   Monia Pouch, FNP-C [facility] [phone]

## 2019-08-03 NOTE — Patient Instructions (Signed)
Testosterone injection What is this medicine? TESTOSTERONE (tes TOS ter one) is the main male hormone. It supports normal male development such as muscle growth, facial hair, and deep voice. It is used in males to treat low testosterone levels. This medicine may be used for other purposes; ask your health care provider or pharmacist if you have questions. COMMON BRAND NAME(S): Andro-L.A., Aveed, Delatestryl, Depo-Testosterone, Virilon What should I tell my health care provider before I take this medicine? They need to know if you have any of these conditions:  cancer  diabetes  heart disease  kidney disease  liver disease  lung disease  prostate disease  an unusual or allergic reaction to testosterone, other medicines, foods, dyes, or preservatives  pregnant or trying to get pregnant  breast-feeding How should I use this medicine? This medicine is for injection into a muscle. It is usually given by a health care professional in a hospital or clinic setting. Contact your pediatrician regarding the use of this medicine in children. While this medicine may be prescribed for children as young as 12 years of age for selected conditions, precautions do apply. Overdosage: If you think you have taken too much of this medicine contact a poison control center or emergency room at once. NOTE: This medicine is only for you. Do not share this medicine with others. What if I miss a dose? Try not to miss a dose. Your doctor or health care professional will tell you when your next injection is due. Notify the office if you are unable to keep an appointment. What may interact with this medicine?  medicines for diabetes  medicines that treat or prevent blood clots like warfarin  oxyphenbutazone  propranolol  steroid medicines like prednisone or cortisone This list may not describe all possible interactions. Give your health care provider a list of all the medicines, herbs, non-prescription  drugs, or dietary supplements you use. Also tell them if you smoke, drink alcohol, or use illegal drugs. Some items may interact with your medicine. What should I watch for while using this medicine? Visit your doctor or health care professional for regular checks on your progress. They will need to check the level of testosterone in your blood. This medicine is only approved for use in men who have low levels of testosterone related to certain medical conditions. Heart attacks and strokes have been reported with the use of this medicine. Notify your doctor or health care professional and seek emergency treatment if you develop breathing problems; changes in vision; confusion; chest pain or chest tightness; sudden arm pain; severe, sudden headache; trouble speaking or understanding; sudden numbness or weakness of the face, arm or leg; loss of balance or coordination. Talk to your doctor about the risks and benefits of this medicine. This medicine may affect blood sugar levels. If you have diabetes, check with your doctor or health care professional before you change your diet or the dose of your diabetic medicine. Testosterone injections are not commonly used in women. Women should inform their doctor if they wish to become pregnant or think they might be pregnant. There is a potential for serious side effects to an unborn child. Talk to your health care professional or pharmacist for more information. Talk with your doctor or health care professional about your birth control options while taking this medicine. This drug is banned from use in athletes by most athletic organizations. What side effects may I notice from receiving this medicine? Side effects that you should report to   your doctor or health care professional as soon as possible:  allergic reactions like skin rash, itching or hives, swelling of the face, lips, or tongue  breast enlargement  breathing problems  changes in emotions or  moods  deep or hoarse voice  irregular menstrual periods  signs and symptoms of liver injury like dark yellow or brown urine; general ill feeling or flu-like symptoms; light-colored stools; loss of appetite; nausea; right upper belly pain; unusually weak or tired; yellowing of the eyes or skin  stomach pain  swelling of the ankles, feet, hands  too frequent or persistent erections  trouble passing urine or change in the amount of urine Side effects that usually do not require medical attention (report to your doctor or health care professional if they continue or are bothersome):  acne  change in sex drive or performance  facial hair growth  hair loss  headache This list may not describe all possible side effects. Call your doctor for medical advice about side effects. You may report side effects to FDA at 1-800-FDA-1088. Where should I keep my medicine? Keep out of the reach of children. This medicine can be abused. Keep your medicine in a safe place to protect it from theft. Do not share this medicine with anyone. Selling or giving away this medicine is dangerous and against the law. Store at room temperature between 20 and 25 degrees C (68 and 77 degrees F). Do not freeze. Protect from light. Follow the directions for the product you are prescribed. Throw away any unused medicine after the expiration date. NOTE: This sheet is a summary. It may not cover all possible information. If you have questions about this medicine, talk to your doctor, pharmacist, or health care provider.  2020 Elsevier/Gold Standard (2015-10-15 07:33:55)  

## 2019-08-04 LAB — CMP14+EGFR
ALT: 26 IU/L (ref 0–44)
AST: 16 IU/L (ref 0–40)
Albumin/Globulin Ratio: 1.5 (ref 1.2–2.2)
Albumin: 4 g/dL (ref 4.0–5.0)
Alkaline Phosphatase: 70 IU/L (ref 39–117)
BUN/Creatinine Ratio: 24 — ABNORMAL HIGH (ref 9–20)
BUN: 20 mg/dL (ref 6–24)
Bilirubin Total: 0.3 mg/dL (ref 0.0–1.2)
CO2: 23 mmol/L (ref 20–29)
Calcium: 8.9 mg/dL (ref 8.7–10.2)
Chloride: 103 mmol/L (ref 96–106)
Creatinine, Ser: 0.85 mg/dL (ref 0.76–1.27)
GFR calc Af Amer: 117 mL/min/{1.73_m2} (ref >59)
GFR calc non Af Amer: 102 mL/min/{1.73_m2} (ref >59)
Globulin, Total: 2.7 g/dL (ref 1.5–4.5)
Glucose: 108 mg/dL — ABNORMAL HIGH (ref 65–99)
Potassium: 4.1 mmol/L (ref 3.5–5.2)
Sodium: 140 mmol/L (ref 134–144)
Total Protein: 6.7 g/dL (ref 6.0–8.5)

## 2019-08-04 LAB — CBC WITH DIFFERENTIAL/PLATELET
Basophils Absolute: 0 10*3/uL (ref 0.0–0.2)
Basos: 0 %
EOS (ABSOLUTE): 0.1 10*3/uL (ref 0.0–0.4)
Eos: 1 %
Hematocrit: 47.4 % (ref 37.5–51.0)
Hemoglobin: 16.1 g/dL (ref 13.0–17.7)
Immature Grans (Abs): 0.1 10*3/uL (ref 0.0–0.1)
Immature Granulocytes: 1 %
Lymphocytes Absolute: 1.9 10*3/uL (ref 0.7–3.1)
Lymphs: 24 %
MCH: 28.3 pg (ref 26.6–33.0)
MCHC: 34 g/dL (ref 31.5–35.7)
MCV: 83 fL (ref 79–97)
Monocytes Absolute: 0.7 10*3/uL (ref 0.1–0.9)
Monocytes: 9 %
Neutrophils Absolute: 5.2 10*3/uL (ref 1.4–7.0)
Neutrophils: 65 %
Platelets: 255 10*3/uL (ref 150–450)
RBC: 5.69 x10E6/uL (ref 4.14–5.80)
RDW: 13.1 % (ref 11.6–15.4)
WBC: 8 10*3/uL (ref 3.4–10.8)

## 2019-08-04 LAB — TESTOSTERONE,FREE AND TOTAL
Testosterone, Free: 26.4 pg/mL — ABNORMAL HIGH (ref 7.2–24.0)
Testosterone: 358 ng/dL (ref 264–916)

## 2019-08-05 ENCOUNTER — Other Ambulatory Visit: Payer: Self-pay | Admitting: Family Medicine

## 2019-08-05 ENCOUNTER — Other Ambulatory Visit: Payer: Self-pay | Admitting: *Deleted

## 2019-08-05 DIAGNOSIS — L719 Rosacea, unspecified: Secondary | ICD-10-CM

## 2019-08-13 ENCOUNTER — Other Ambulatory Visit: Payer: Self-pay | Admitting: *Deleted

## 2019-08-13 DIAGNOSIS — E291 Testicular hypofunction: Secondary | ICD-10-CM

## 2019-08-21 ENCOUNTER — Other Ambulatory Visit: Payer: Self-pay | Admitting: Family Medicine

## 2019-08-21 DIAGNOSIS — E291 Testicular hypofunction: Secondary | ICD-10-CM

## 2019-09-14 ENCOUNTER — Other Ambulatory Visit: Payer: Self-pay | Admitting: *Deleted

## 2019-09-14 DIAGNOSIS — N529 Male erectile dysfunction, unspecified: Secondary | ICD-10-CM

## 2019-09-14 DIAGNOSIS — E291 Testicular hypofunction: Secondary | ICD-10-CM

## 2019-09-14 MED ORDER — SILDENAFIL CITRATE 100 MG PO TABS: 100.0000 mg | ORAL_TABLET | Freq: Every day | ORAL | 0 refills | Status: DC | PRN

## 2019-11-05 ENCOUNTER — Ambulatory Visit: Payer: Managed Care, Other (non HMO) | Admitting: Family Medicine

## 2019-11-20 ENCOUNTER — Other Ambulatory Visit: Payer: Self-pay | Admitting: Family Medicine

## 2020-02-17 ENCOUNTER — Other Ambulatory Visit: Payer: Self-pay | Admitting: *Deleted

## 2020-02-17 DIAGNOSIS — I10 Essential (primary) hypertension: Secondary | ICD-10-CM

## 2020-02-17 MED ORDER — OLMESARTAN MEDOXOMIL 20 MG PO TABS: 20.0000 mg | ORAL_TABLET | Freq: Every day | ORAL | 0 refills | Status: DC

## 2020-02-17 MED ORDER — METOPROLOL SUCCINATE ER 25 MG PO TB24: 25.0000 mg | ORAL_TABLET | Freq: Every day | ORAL | 0 refills | Status: DC

## 2020-03-14 ENCOUNTER — Other Ambulatory Visit: Payer: Self-pay | Admitting: Family Medicine

## 2020-03-15 ENCOUNTER — Other Ambulatory Visit: Payer: Self-pay | Admitting: *Deleted

## 2020-03-15 DIAGNOSIS — I10 Essential (primary) hypertension: Secondary | ICD-10-CM

## 2020-03-15 NOTE — Telephone Encounter (Signed)
Former Rakes NTBS 30 days given 02/17/20

## 2020-03-15 NOTE — Telephone Encounter (Signed)
Appointment scheduled 03/18/2020 with Deliah Boston, patient aware.

## 2020-03-17 ENCOUNTER — Other Ambulatory Visit: Payer: Self-pay | Admitting: *Deleted

## 2020-03-17 DIAGNOSIS — I10 Essential (primary) hypertension: Secondary | ICD-10-CM

## 2020-03-18 ENCOUNTER — Encounter: Payer: Self-pay | Admitting: Family Medicine

## 2020-03-18 ENCOUNTER — Ambulatory Visit (INDEPENDENT_AMBULATORY_CARE_PROVIDER_SITE_OTHER): Payer: 59 | Admitting: Family Medicine

## 2020-03-18 ENCOUNTER — Other Ambulatory Visit: Payer: Self-pay

## 2020-03-18 VITALS — BP 125/83 | HR 87 | Temp 98.3°F | Ht 73.0 in | Wt 380.4 lb

## 2020-03-18 DIAGNOSIS — E781 Pure hyperglyceridemia: Secondary | ICD-10-CM

## 2020-03-18 DIAGNOSIS — I10 Essential (primary) hypertension: Secondary | ICD-10-CM | POA: Diagnosis not present

## 2020-03-18 DIAGNOSIS — J302 Other seasonal allergic rhinitis: Secondary | ICD-10-CM

## 2020-03-18 DIAGNOSIS — N529 Male erectile dysfunction, unspecified: Secondary | ICD-10-CM | POA: Diagnosis not present

## 2020-03-18 DIAGNOSIS — E291 Testicular hypofunction: Secondary | ICD-10-CM | POA: Diagnosis not present

## 2020-03-18 MED ORDER — OLMESARTAN MEDOXOMIL 20 MG PO TABS: 20.0000 mg | ORAL_TABLET | Freq: Every day | ORAL | 1 refills | Status: DC

## 2020-03-18 MED ORDER — METOPROLOL SUCCINATE ER 25 MG PO TB24: 25.0000 mg | ORAL_TABLET | Freq: Every day | ORAL | 1 refills | Status: DC

## 2020-03-18 MED ORDER — TESTOSTERONE ENANTHATE 75 MG/0.5ML ~~LOC~~ SOAJ: 100.0000 mg | SUBCUTANEOUS | 4 refills | Status: DC

## 2020-03-18 MED ORDER — SILDENAFIL CITRATE 100 MG PO TABS: 100.0000 mg | ORAL_TABLET | Freq: Every day | ORAL | 2 refills | Status: DC | PRN

## 2020-03-18 MED ORDER — FEXOFENADINE HCL 180 MG PO TABS: 180.0000 mg | ORAL_TABLET | Freq: Every day | ORAL | 1 refills | Status: DC

## 2020-03-18 NOTE — Progress Notes (Signed)
Assessment & Plan:  1. Essential hypertension with goal blood pressure less than 130/80 - Well controlled on current regimen.  - olmesartan (BENICAR) 20 MG tablet; Take 1 tablet (20 mg total) by mouth daily.  Dispense: 90 tablet; Refill: 1 - metoprolol succinate (TOPROL-XL) 25 MG 24 hr tablet; Take 1 tablet (25 mg total) by mouth daily.  Dispense: 90 tablet; Refill: 1  2. Hypertriglyceridemia - ASCVD risk score of 4.7%.  Last lipid panel September 2020.  Will repeat at his physical.  3. Hypogonadism in male - Well controlled on current regimen.  - Testosterone Enanthate 75 MG/0.5ML SOAJ; Inject 100 mg into the muscle once a week.  Dispense: 0.5 mL; Refill: 4 - sildenafil (VIAGRA) 100 MG tablet; Take 1 tablet (100 mg total) by mouth daily as needed for erectile dysfunction.  Dispense: 30 tablet; Refill: 2  4. Primary erectile dysfunction - Well controlled on current regimen.  - sildenafil (VIAGRA) 100 MG tablet; Take 1 tablet (100 mg total) by mouth daily as needed for erectile dysfunction.  Dispense: 30 tablet; Refill: 2  5. Seasonal allergies - Well controlled on current regimen.  - fexofenadine (ALLEGRA) 180 MG tablet; Take 1 tablet (180 mg total) by mouth daily.  Dispense: 90 tablet; Refill: 1   Return on/after 06/22/2020, for annual physical.  [name], MSN, APRN, FNP-C [facility]  Subjective:    Patient ID: Francisco Adams, male    DOB: 11-Apr-1971, 49 y.o.   MRN: [id number]  Patient Care Team: Loman Brooklyn, FNP as PCP - General (Family Medicine)   Chief Complaint:  Chief Complaint  Patient presents with  . Establish Care    rakes pt  . Hypertension    6 month follow up    HPI: Francisco Adams is a 49 y.o. male presenting on 03/18/2020 for Establish Care (rakes pt) and Hypertension (6 month follow up)  Patient has no complaints or concerns today.   Social history:  Relevant past medical, surgical, family and social history  reviewed and updated as indicated. Interim medical history since our last visit reviewed.  Allergies and medications reviewed and updated.  DATA REVIEWED: CHART IN EPIC  ROS: Negative unless specifically indicated above in HPI.    Current Outpatient Medications:  .  clotrimazole-betamethasone (LOTRISONE) cream, Apply 1 application topically 2 (two) times daily., Disp: 30 g, Rfl: 3 .  fexofenadine (ALLEGRA) 180 MG tablet, Take 180 mg by mouth daily., Disp: , Rfl:  .  metoprolol succinate (TOPROL-XL) 25 MG 24 hr tablet, Take 1 tablet (25 mg total) by mouth daily. (Needs to be seen before next refill), Disp: 30 tablet, Rfl: 0 .  olmesartan (BENICAR) 20 MG tablet, Take 1 tablet (20 mg total) by mouth daily. (Needs to be seen before next refill), Disp: 30 tablet, Rfl: 0 .  sildenafil (VIAGRA) 100 MG tablet, Take 1 tablet (100 mg total) by mouth daily as needed for erectile dysfunction., Disp: 10 tablet, Rfl: 0 .  Testosterone Enanthate 75 MG/0.5ML SOAJ, Inject 100 mg into the muscle once a week., Disp: 0.5 mL, Rfl: 4   No Known Allergies Past Medical History:  Diagnosis Date  . Arthritis   . Dermatitis    head  and face   . Palpitations   . Sleep apnea    cpap   . Testosterone deficiency     Past Surgical History:  Procedure Laterality Date  . ANKLE RECONSTRUCTION Right   . SPINE SURGERY  cervical - 3 disc fusions    Social History   Socioeconomic History  . Marital status: Divorced    Spouse name: Not on file  . Number of children: 3  . Years of education: Not on file  . Highest education level: Not on file  Occupational History  . Not on file  Tobacco Use  . Smoking status: Former Games developer  . Smokeless tobacco: Current User    Types: Chew  Vaping Use  . Vaping Use: Never used  Substance and Sexual Activity  . Alcohol use: Never  . Drug use: Never  . Sexual activity: Not on file  Other Topics Concern  . Not on file  Social History Narrative  . Not on file    Social Determinants of Health   Financial Resource Strain:   . Difficulty of Paying Living Expenses:   Food Insecurity:   . Worried About Programme researcher, broadcasting/film/video in the Last Year:   . Barista in the Last Year:   Transportation Needs:   . Freight forwarder (Medical):   Marland Kitchen Lack of Transportation (Non-Medical):   Physical Activity:   . Days of Exercise per Week:   . Minutes of Exercise per Session:   Stress:   . Feeling of Stress :   Social Connections:   . Frequency of Communication with Friends and Family:   . Frequency of Social Gatherings with Friends and Family:   . Attends Religious Services:   . Active Member of Clubs or Organizations:   . Attends Banker Meetings:   Marland Kitchen Marital Status:   Intimate Partner Violence:   . Fear of Current or Ex-Partner:   . Emotionally Abused:   Marland Kitchen Physically Abused:   . Sexually Abused:         Objective:    BP 125/83   Pulse 87   Temp 98.3 F (36.8 C) (Temporal)   Ht 6\' 1"  (1.854 m)   Wt (!) 380 lb 6.4 oz (172.5 kg)   SpO2 96%   BMI 50.19 kg/m   Wt Readings from Last 3 Encounters:  03/18/20 (!) 380 lb 6.4 oz (172.5 kg)  08/03/19 (!) 377 lb (171 kg)  06/23/19 (!) 377 lb (171 kg)    Physical Exam Vitals reviewed.  Constitutional:      General: He is not in acute distress.    Appearance: Normal appearance. He is morbidly obese. He is not ill-appearing, toxic-appearing or diaphoretic.  HENT:     Head: Normocephalic and atraumatic.  Eyes:     General: No scleral icterus.       Right eye: No discharge.        Left eye: No discharge.     Conjunctiva/sclera: Conjunctivae normal.  Cardiovascular:     Rate and Rhythm: Normal rate and regular rhythm.     Heart sounds: Normal heart sounds. No murmur heard.  No friction rub. No gallop.   Pulmonary:     Effort: Pulmonary effort is normal. No respiratory distress.     Breath sounds: Normal breath sounds. No stridor. No wheezing, rhonchi or rales.   Musculoskeletal:        General: Normal range of motion.     Cervical back: Normal range of motion.  Skin:    General: Skin is warm and dry.  Neurological:     Mental Status: He is alert and oriented to person, place, and time. Mental status is at baseline.  Psychiatric:  Mood and Affect: Mood normal.        Behavior: Behavior normal.        Thought Content: Thought content normal.        Judgment: Judgment normal.     Lab Results  Component Value Date   TSH 1.980 06/22/2019   Lab Results  Component Value Date   WBC 8.0 08/03/2019   HGB 16.1 08/03/2019   HCT 47.4 08/03/2019   MCV 83 08/03/2019   PLT 255 08/03/2019   Lab Results  Component Value Date   NA 140 08/03/2019   K 4.1 08/03/2019   CO2 23 08/03/2019   GLUCOSE 108 (H) 08/03/2019   BUN 20 08/03/2019   CREATININE 0.85 08/03/2019   BILITOT 0.3 08/03/2019   ALKPHOS 70 08/03/2019   AST 16 08/03/2019   ALT 26 08/03/2019   PROT 6.7 08/03/2019   ALBUMIN 4.0 08/03/2019   CALCIUM 8.9 08/03/2019   Lab Results  Component Value Date   CHOL 166 06/22/2019   Lab Results  Component Value Date   HDL 36 (L) 06/22/2019   Lab Results  Component Value Date   LDLCALC 95 06/22/2019   Lab Results  Component Value Date   TRIG 203 (H) 06/22/2019   Lab Results  Component Value Date   CHOLHDL 4.6 06/22/2019   No results found for: HGBA1C

## 2020-03-21 ENCOUNTER — Telehealth: Payer: Self-pay | Admitting: *Deleted

## 2020-03-21 DIAGNOSIS — E291 Testicular hypofunction: Secondary | ICD-10-CM

## 2020-03-21 MED ORDER — TESTOSTERONE CYPIONATE 100 MG/ML IJ SOLN: 100.0000 mg | INTRAMUSCULAR | 2 refills | Status: DC

## 2020-03-21 NOTE — Telephone Encounter (Signed)
PA came in on med: XYOSTED 75MG /0.5ML for pt.   Is not covered by pts insurance   Preferred by plan: Testosterone Cypionate 200 mg/ ml oil Testosterone Enanthate  (neither of these need PA)

## 2020-03-21 NOTE — Telephone Encounter (Signed)
Pt notified of change.

## 2020-03-21 NOTE — Telephone Encounter (Signed)
Order changed.

## 2020-03-25 ENCOUNTER — Telehealth: Payer: Self-pay | Admitting: Family Medicine

## 2020-03-25 DIAGNOSIS — E291 Testicular hypofunction: Secondary | ICD-10-CM

## 2020-03-25 NOTE — Telephone Encounter (Signed)
Could provider please send in new corrected prescription. Thanks

## 2020-03-25 NOTE — Telephone Encounter (Signed)
[facility] calling and states the Testosterone Cypionate 100 MG/ML SOLN does not come in a and requesting a rx to be sent. Please advise.

## 2020-03-28 MED ORDER — TESTOSTERONE CYPIONATE 100 MG/ML IJ SOLN: 100.0000 mg | INTRAMUSCULAR | 2 refills | Status: DC

## 2020-03-28 NOTE — Addendum Note (Signed)
Addended by: Deliah Boston F on: 03/28/2020 12:09 PM   Modules accepted: Orders

## 2020-03-29 MED ORDER — TESTOSTERONE CYPIONATE 200 MG/ML IM SOLN
100.0000 mg | INTRAMUSCULAR | 2 refills | Status: DC
Start: 2020-03-29 — End: 2020-06-24

## 2020-03-29 NOTE — Addendum Note (Signed)
Addended by: Gwenlyn Fudge on: 03/29/2020 04:41 PM   Modules accepted: Orders

## 2020-03-29 NOTE — Telephone Encounter (Signed)
Received another PA for Testosterone Cypionate 100MG /ML intramuscular solution   See previous- the preferred is Testosterone Cypionate 200mg /ml oil or Testosterone Enanthate  I think we just need to change the dosage to 200mg /ml

## 2020-04-18 ENCOUNTER — Encounter: Payer: Self-pay | Admitting: Family Medicine

## 2020-04-18 ENCOUNTER — Other Ambulatory Visit: Payer: Self-pay

## 2020-04-18 ENCOUNTER — Ambulatory Visit (INDEPENDENT_AMBULATORY_CARE_PROVIDER_SITE_OTHER): Payer: 59 | Admitting: Family Medicine

## 2020-04-18 VITALS — BP 137/87 | HR 87 | Temp 97.6°F | Ht 73.0 in | Wt 385.4 lb

## 2020-04-18 DIAGNOSIS — M545 Low back pain: Secondary | ICD-10-CM | POA: Diagnosis not present

## 2020-04-18 DIAGNOSIS — M5459 Other low back pain: Secondary | ICD-10-CM

## 2020-04-18 MED ORDER — DICLOFENAC SODIUM 75 MG PO TBEC
75.0000 mg | DELAYED_RELEASE_TABLET | Freq: Two times a day (BID) | ORAL | 0 refills | Status: DC
Start: 2020-04-18 — End: 2020-09-26

## 2020-04-18 MED ORDER — TIZANIDINE HCL 6 MG PO CAPS
6.0000 mg | ORAL_CAPSULE | Freq: Three times a day (TID) | ORAL | 1 refills | Status: DC | PRN
Start: 2020-04-18 — End: 2020-09-26

## 2020-04-18 NOTE — Patient Instructions (Signed)

## 2020-04-18 NOTE — Progress Notes (Signed)
Chief Complaint  Patient presents with  . Back Pain    Lower, 4 days,     HPI  Patient presents today for 4 days of low back pain.  He says it was 8 out of 10 at onset but has dropped to about a 3 out of 10 now.  He has chronic back pain for about 20 years there occurs about once a year similar to this.  This time he says he said it is in training class for about 8 hours with only 310-minute breaks.  He has no history of injury.  Patient realizes he needs to lose quite a bit of weight and that it is contributing to his situation.  He tells me that he had some old muscle relaxers that helped a little bit over the weekend as well.  Pain does not radiate.  It does slow him down a bit.  He has some back exercises that he does chronically as well.  PMH: Smoking status noted ROS: Per HPI  Objective: BP (!) 137/87   Pulse 87   Temp 97.6 F (36.4 C) (Temporal)   Ht 6\' 1"  (1.854 m)   Wt (!) 385 lb 6.4 oz (174.8 kg)   BMI 50.85 kg/m  Gen: NAD, alert, cooperative with exam HEENT: NCAT, EOMI, PERRL CV: RRR, good S1/S2, no murmur Resp: CTABL, no wheezes, non-labored Abd: SNTND, BS present, no guarding or organomegaly Ext: No edema, warm Neuro: Alert and oriented, No gross deficits  Assessment and plan:  1. Mechanical low back pain     Meds ordered this encounter  Medications  . diclofenac (VOLTAREN) 75 MG EC tablet    Sig: Take 1 tablet (75 mg total) by mouth 2 (two) times daily. For muscle and  Joint pain    Dispense:  60 tablet    Refill:  0  . tizanidine (ZANAFLEX) 6 MG capsule    Sig: Take 1 capsule (6 mg total) by mouth 3 (three) times daily as needed for muscle spasms.    Dispense:  90 capsule    Refill:  1    No orders of the defined types were placed in this encounter.   Follow up as needed.  , MD

## 2020-06-13 ENCOUNTER — Other Ambulatory Visit: Payer: Self-pay | Admitting: *Deleted

## 2020-06-13 DIAGNOSIS — L719 Rosacea, unspecified: Secondary | ICD-10-CM

## 2020-06-13 MED ORDER — CLOTRIMAZOLE-BETAMETHASONE 1-0.05 % EX CREA: 1.0000 "application " | TOPICAL_CREAM | Freq: Two times a day (BID) | CUTANEOUS | 3 refills | Status: DC

## 2020-06-24 ENCOUNTER — Encounter: Payer: Self-pay | Admitting: Family Medicine

## 2020-06-24 ENCOUNTER — Ambulatory Visit (INDEPENDENT_AMBULATORY_CARE_PROVIDER_SITE_OTHER): Payer: 59 | Admitting: Family Medicine

## 2020-06-24 ENCOUNTER — Other Ambulatory Visit: Payer: Self-pay

## 2020-06-24 VITALS — BP 137/86 | HR 80 | Temp 98.1°F | Ht 73.0 in | Wt 385.2 lb

## 2020-06-24 DIAGNOSIS — Z125 Encounter for screening for malignant neoplasm of prostate: Secondary | ICD-10-CM

## 2020-06-24 DIAGNOSIS — I10 Essential (primary) hypertension: Secondary | ICD-10-CM | POA: Diagnosis not present

## 2020-06-24 DIAGNOSIS — E291 Testicular hypofunction: Secondary | ICD-10-CM | POA: Diagnosis not present

## 2020-06-24 DIAGNOSIS — J302 Other seasonal allergic rhinitis: Secondary | ICD-10-CM | POA: Insufficient documentation

## 2020-06-24 DIAGNOSIS — Z0000 Encounter for general adult medical examination without abnormal findings: Secondary | ICD-10-CM

## 2020-06-24 DIAGNOSIS — E781 Pure hyperglyceridemia: Secondary | ICD-10-CM

## 2020-06-24 DIAGNOSIS — Z0001 Encounter for general adult medical examination with abnormal findings: Secondary | ICD-10-CM

## 2020-06-24 MED ORDER — OLMESARTAN MEDOXOMIL 20 MG PO TABS: 20.0000 mg | ORAL_TABLET | Freq: Every day | ORAL | 3 refills | Status: DC

## 2020-06-24 MED ORDER — TESTOSTERONE CYPIONATE 200 MG/ML IM SOLN: 100.0000 mg | INTRAMUSCULAR | 1 refills | Status: DC

## 2020-06-24 MED ORDER — METOPROLOL SUCCINATE ER 25 MG PO TB24: 25.0000 mg | ORAL_TABLET | Freq: Every day | ORAL | 3 refills | Status: DC

## 2020-06-24 MED ORDER — FEXOFENADINE HCL 180 MG PO TABS: 180.0000 mg | ORAL_TABLET | Freq: Every day | ORAL | 3 refills | Status: DC

## 2020-06-24 NOTE — Progress Notes (Signed)
Assessment & Plan:  1. Well adult exam - Preventive health education provided. Patient is UTD with TDAP. He declined Shingrix, influenza, COVID-19 vaccines, hepatitis C and HIV screening.  - CBC with Differential/Platelet - CMP14+EGFR - Lipid panel - TSH  2. Essential hypertension  - Well controlled on current regimen.  - CBC with Differential/Platelet - CMP14+EGFR - Lipid panel - TSH - metoprolol succinate (TOPROL-XL) 25 MG 24 hr tablet; Take 1 tablet (25 mg total) by mouth daily.  Dispense: 90 tablet; Refill: 3 - olmesartan (BENICAR) 20 MG tablet; Take 1 tablet (20 mg total) by mouth daily.  Dispense: 90 tablet; Refill: 3  3. Hypertriglyceridemia - Well controlled on current regimen.   4. Hypogonadism in male - Well controlled on current regimen.  - Testosterone,Free and Total - testosterone cypionate (DEPOTESTOSTERONE CYPIONATE) 200 MG/ML injection; Inject 0.5 mLs (100 mg total) into the muscle every 14 (fourteen) days.  Dispense: 10 mL; Refill: 1  5. Morbid obesity ([place]) - Diet and exercise encouraged.   6. Seasonal allergies - Well controlled on current regimen.  - fexofenadine (ALLEGRA) 180 MG tablet; Take 1 tablet (180 mg total) by mouth daily.  Dispense: 90 tablet; Refill: 3  7. Prostate cancer screening - PSA, total and free   Follow-up: Return in about 1 year (around 06/24/2021) for annual physical.   [name], MSN, APRN, FNP-C [facility]  Subjective:  Patient ID: Francisco Adams, male    DOB: [birth year]  Age: 49 y.o. MRN: [id number]  Patient Care Team: Loman Brooklyn, FNP as PCP - General (Family Medicine)   CC:  Chief Complaint  Patient presents with  . Annual Exam    HPI Francisco Adams presents for his annual physical.  Substance use: none Diet: none, Exercise: none Last eye exam: 20+ years ago Last dental exam: 3 months ago Last colonoscopy: never - patient has Cologuard at home that he needs to  complete Hepatitis C Screening: declined PSA: 2019 Immunizations: Flu Vaccine: declined Tdap Vaccine: up to date  Shingrix Vaccine: declined  COVID-19 Vaccine: declined  DEPRESSION SCREENING PHQ 2/9 Scores 06/24/2020 04/18/2020 03/18/2020 08/03/2019 06/23/2019 05/06/2019  PHQ - 2 Score 0 0 0 0 0 0     Review of Systems  Constitutional: Negative for chills, fever, malaise/fatigue and weight loss.  HENT: Negative for congestion, ear discharge, ear pain, nosebleeds, sinus pain, sore throat and tinnitus.   Eyes: Negative for blurred vision, double vision, pain, discharge and redness.  Respiratory: Negative for cough, shortness of breath and wheezing.   Cardiovascular: Negative for chest pain, palpitations and leg swelling.  Gastrointestinal: Negative for abdominal pain, constipation, diarrhea, heartburn, nausea and vomiting.  Genitourinary: Negative for dysuria, frequency and urgency.  Musculoskeletal: Negative for myalgias.  Skin: Negative for rash.  Neurological: Negative for dizziness, seizures, weakness and headaches.  Psychiatric/Behavioral: Negative for depression, substance abuse and suicidal ideas. The patient is not nervous/anxious.      Current Outpatient Medications:  .  clotrimazole-betamethasone (LOTRISONE) cream, Apply 1 application topically 2 (two) times daily., Disp: 30 g, Rfl: 3 .  diclofenac (VOLTAREN) 75 MG EC tablet, Take 1 tablet (75 mg total) by mouth 2 (two) times daily. For muscle and  Joint pain, Disp: 60 tablet, Rfl: 0 .  fexofenadine (ALLEGRA) 180 MG tablet, Take 1 tablet (180 mg total) by mouth daily., Disp: 90 tablet, Rfl: 1 .  metoprolol succinate (TOPROL-XL) 25 MG 24 hr tablet, Take 1 tablet (25 mg total) by mouth daily.,  Disp: 90 tablet, Rfl: 1 .  olmesartan (BENICAR) 20 MG tablet, Take 1 tablet (20 mg total) by mouth daily., Disp: 90 tablet, Rfl: 1 .  sildenafil (VIAGRA) 100 MG tablet, Take 1 tablet (100 mg total) by mouth daily as needed for erectile  dysfunction., Disp: 30 tablet, Rfl: 2 .  testosterone cypionate (DEPOTESTOSTERONE CYPIONATE) 200 MG/ML injection, Inject 0.5 mLs (100 mg total) into the muscle every 14 (fourteen) days., Disp: 10 mL, Rfl: 2 .  tizanidine (ZANAFLEX) 6 MG capsule, Take 1 capsule (6 mg total) by mouth 3 (three) times daily as needed for muscle spasms., Disp: 90 capsule, Rfl: 1  No Known Allergies  Past Medical History:  Diagnosis Date  . Arthritis   . Dermatitis    head  and face   . Palpitations   . Sleep apnea    cpap   . Testosterone deficiency     Past Surgical History:  Procedure Laterality Date  . ANKLE RECONSTRUCTION Right   . SPINE SURGERY     cervical - 3 disc fusions    Family History  Problem Relation Age of Onset  . Alcohol abuse Mother   . Hypertension Mother   . Heart attack Mother   . Alcohol abuse Father   . Hypertension Father   . Heart attack Father   . Seizures Brother   . AAA (abdominal aortic aneurysm) Brother   . Drug abuse Brother     Social History   Socioeconomic History  . Marital status: Divorced    Spouse name: Not on file  . Number of children: 3  . Years of education: Not on file  . Highest education level: Not on file  Occupational History  . Not on file  Tobacco Use  . Smoking status: Former Research scientist (life sciences)  . Smokeless tobacco: Current User    Types: Chew  Vaping Use  . Vaping Use: Never used  Substance and Sexual Activity  . Alcohol use: Never  . Drug use: Never  . Sexual activity: Not on file  Other Topics Concern  . Not on file  Social History Narrative  . Not on file   Social Determinants of Health   Financial Resource Strain:   . Difficulty of Paying Living Expenses: Not on file  Food Insecurity:   . Worried About Charity fundraiser in the Last Year: Not on file  . Ran Out of Food in the Last Year: Not on file  Transportation Needs:   . Lack of Transportation (Medical): Not on file  . Lack of Transportation (Non-Medical): Not on file   Physical Activity:   . Days of Exercise per Week: Not on file  . Minutes of Exercise per Session: Not on file  Stress:   . Feeling of Stress : Not on file  Social Connections:   . Frequency of Communication with Friends and Family: Not on file  . Frequency of Social Gatherings with Friends and Family: Not on file  . Attends Religious Services: Not on file  . Active Member of Clubs or Organizations: Not on file  . Attends Archivist Meetings: Not on file  . Marital Status: Not on file  Intimate Partner Violence:   . Fear of Current or Ex-Partner: Not on file  . Emotionally Abused: Not on file  . Physically Abused: Not on file  . Sexually Abused: Not on file      Objective:    BP 137/86   Pulse 80   Temp 98.1  F (36.7 C) (Temporal)   Ht 6' 1"  (1.854 m)   Wt (!) 385 lb 3.2 oz (174.7 kg)   SpO2 96%   BMI 50.82 kg/m   Wt Readings from Last 3 Encounters:  06/24/20 (!) 385 lb 3.2 oz (174.7 kg)  04/18/20 (!) 385 lb 6.4 oz (174.8 kg)  03/18/20 (!) 380 lb 6.4 oz (172.5 kg)    Physical Exam Vitals reviewed.  Constitutional:      General: He is not in acute distress.    Appearance: Normal appearance. He is morbidly obese. He is not ill-appearing, toxic-appearing or diaphoretic.  HENT:     Head: Normocephalic and atraumatic.     Right Ear: Tympanic membrane, ear canal and external ear normal. There is no impacted cerumen.     Left Ear: Tympanic membrane, ear canal and external ear normal. There is no impacted cerumen.     Nose: Nose normal. No congestion or rhinorrhea.     Mouth/Throat:     Mouth: Mucous membranes are moist.     Pharynx: Oropharynx is clear. No oropharyngeal exudate or posterior oropharyngeal erythema.  Eyes:     General: No scleral icterus.       Right eye: No discharge.        Left eye: No discharge.     Conjunctiva/sclera: Conjunctivae normal.     Pupils: Pupils are equal, round, and reactive to light.  Neck:     Vascular: No carotid bruit.   Cardiovascular:     Rate and Rhythm: Normal rate and regular rhythm.     Heart sounds: Normal heart sounds. No murmur heard.  No friction rub. No gallop.   Pulmonary:     Effort: Pulmonary effort is normal. No respiratory distress.     Breath sounds: Normal breath sounds. No stridor. No wheezing, rhonchi or rales.  Abdominal:     General: Abdomen is flat. Bowel sounds are normal. There is no distension.     Palpations: Abdomen is soft. There is no mass.     Tenderness: There is no abdominal tenderness. There is no guarding or rebound.     Hernia: No hernia is present.  Musculoskeletal:        General: Normal range of motion.     Cervical back: Normal range of motion and neck supple. No rigidity. No muscular tenderness.     Right lower leg: No edema.     Left lower leg: No edema.  Lymphadenopathy:     Cervical: No cervical adenopathy.  Skin:    General: Skin is warm and dry.     Capillary Refill: Capillary refill takes less than 2 seconds.  Neurological:     General: No focal deficit present.     Mental Status: He is alert and oriented to person, place, and time. Mental status is at baseline.  Psychiatric:        Mood and Affect: Mood normal.        Behavior: Behavior normal.        Thought Content: Thought content normal.        Judgment: Judgment normal.     Lab Results  Component Value Date   TSH 1.980 06/22/2019   Lab Results  Component Value Date   WBC 8.0 08/03/2019   HGB 16.1 08/03/2019   HCT 47.4 08/03/2019   MCV 83 08/03/2019   PLT 255 08/03/2019   Lab Results  Component Value Date   NA 140 08/03/2019   K 4.1 08/03/2019  CO2 23 08/03/2019   GLUCOSE 108 (H) 08/03/2019   BUN 20 08/03/2019   CREATININE 0.85 08/03/2019   BILITOT 0.3 08/03/2019   ALKPHOS 70 08/03/2019   AST 16 08/03/2019   ALT 26 08/03/2019   PROT 6.7 08/03/2019   ALBUMIN 4.0 08/03/2019   CALCIUM 8.9 08/03/2019   Lab Results  Component Value Date   CHOL 166 06/22/2019   Lab  Results  Component Value Date   HDL 36 (L) 06/22/2019   Lab Results  Component Value Date   LDLCALC 95 06/22/2019   Lab Results  Component Value Date   TRIG 203 (H) 06/22/2019   Lab Results  Component Value Date   CHOLHDL 4.6 06/22/2019   No results found for: HGBA1C

## 2020-06-24 NOTE — Patient Instructions (Signed)
Preventive Care 51-51 Years Old, Male Preventive care refers to lifestyle choices and visits with your health care provider that can promote health and wellness. This includes:  A yearly physical exam. This is also called an annual well check.  Regular dental and eye exams.  Immunizations.  Screening for certain conditions.  Healthy lifestyle choices, such as eating a healthy diet, getting regular exercise, not using drugs or products that contain nicotine and tobacco, and limiting alcohol use. What can I expect for my preventive care visit? Physical exam Your health care provider will check:  Height and weight. These may be used to calculate body mass index (BMI), which is a measurement that tells if you are at a healthy weight.  Heart rate and blood pressure.  Your skin for abnormal spots. Counseling Your health care provider may ask you questions about:  Alcohol, tobacco, and drug use.  Emotional well-being.  Home and relationship well-being.  Sexual activity.  Eating habits.  Work and work Statistician. What immunizations do I need?  Influenza (flu) vaccine  This is recommended every year. Tetanus, diphtheria, and pertussis (Tdap) vaccine  You may need a Td booster every 10 years. Varicella (chickenpox) vaccine  You may need this vaccine if you have not already been vaccinated. Zoster (shingles) vaccine  You may need this after age 51. Measles, mumps, and rubella (MMR) vaccine  You may need at least one dose of MMR if you were born in 1957 or later. You may also need a second dose. Pneumococcal conjugate (PCV13) vaccine  You may need this if you have certain conditions and were not previously vaccinated. Pneumococcal polysaccharide (PPSV23) vaccine  You may need one or two doses if you smoke cigarettes or if you have certain conditions. Meningococcal conjugate (MenACWY) vaccine  You may need this if you have certain conditions. Hepatitis A  vaccine  You may need this if you have certain conditions or if you travel or work in places where you may be exposed to hepatitis A. Hepatitis B vaccine  You may need this if you have certain conditions or if you travel or work in places where you may be exposed to hepatitis B. Haemophilus influenzae type b (Hib) vaccine  You may need this if you have certain risk factors. Human papillomavirus (HPV) vaccine  If recommended by your health care provider, you may need three doses over 6 months. You may receive vaccines as individual doses or as more than one vaccine together in one shot (combination vaccines). Talk with your health care provider about the risks and benefits of combination vaccines. What tests do I need? Blood tests  Lipid and cholesterol levels. These may be checked every 5 years, or more frequently if you are over 25 years old.  Hepatitis C test.  Hepatitis B test. Screening  Lung cancer screening. You may have this screening every year starting at age 51 if you have a 30-pack-year history of smoking and currently smoke or have quit within the past 15 years.  Prostate cancer screening. Recommendations will vary depending on your family history and other risks.  Colorectal cancer screening. All adults should have this screening starting at age 51 and continuing until age 72. Your health care provider may recommend screening at age 51 if you are at increased risk. You will have tests every 1-10 years, depending on your results and the type of screening test.  Diabetes screening. This is done by checking your blood sugar (glucose) after you have not  eaten for a while (fasting). You may have this done every 1-3 years.  Sexually transmitted disease (STD) testing. Follow these instructions at home: Eating and drinking  Eat a diet that includes fresh fruits and vegetables, whole grains, lean protein, and low-fat dairy products.  Take vitamin and mineral supplements as  recommended by your health care provider.  Do not drink alcohol if your health care provider tells you not to drink.  If you drink alcohol: ? Limit how much you have to 0-2 drinks a day. ? Be aware of how much alcohol is in your drink. In the [place] one drink equals one 12 oz bottle of beer (355 mL), one 5 oz glass of wine (148 mL), or one 1 oz glass of hard liquor (44 mL). Lifestyle  Take daily care of your teeth and gums.  Stay active. Exercise for at least 30 minutes on 5 or more days each week.  Do not use any products that contain nicotine or tobacco, such as cigarettes, e-cigarettes, and chewing tobacco. If you need help quitting, ask your health care provider.  If you are sexually active, practice safe sex. Use a condom or other form of protection to prevent STIs (sexually transmitted infections).  Talk with your health care provider about taking a low-dose aspirin every day starting at age 51. What's next?  Go to your health care provider once a year for a well check visit.  Ask your health care provider how often you should have your eyes and teeth checked.  Stay up to date on all vaccines. This information is not intended to replace advice given to you by your health care provider. Make sure you discuss any questions you have with your health care provider. Document Revised: 09/04/2018 Document Reviewed: 09/04/2018 Elsevier Patient Education  2020 [employer].

## 2020-06-28 LAB — CMP14+EGFR
ALT: 24 IU/L (ref 0–44)
AST: 18 IU/L (ref 0–40)
Albumin/Globulin Ratio: 1.6 (ref 1.2–2.2)
Albumin: 4.3 g/dL (ref 3.8–4.9)
Alkaline Phosphatase: 70 IU/L (ref 44–121)
BUN/Creatinine Ratio: 16 (ref 9–20)
BUN: 14 mg/dL (ref 6–24)
Bilirubin Total: <0.2 mg/dL (ref 0.0–1.2)
CO2: 21 mmol/L (ref 20–29)
Calcium: 8.9 mg/dL (ref 8.7–10.2)
Chloride: 102 mmol/L (ref 96–106)
Creatinine, Ser: 0.88 mg/dL (ref 0.76–1.27)
GFR calc Af Amer: 115 mL/min/{1.73_m2} (ref >59)
GFR calc non Af Amer: 99 mL/min/{1.73_m2} (ref >59)
Globulin, Total: 2.7 g/dL (ref 1.5–4.5)
Glucose: 106 mg/dL — ABNORMAL HIGH (ref 65–99)
Potassium: 4 mmol/L (ref 3.5–5.2)
Sodium: 137 mmol/L (ref 134–144)
Total Protein: 7 g/dL (ref 6.0–8.5)

## 2020-06-28 LAB — TSH: TSH: 2.24 u[IU]/mL (ref 0.450–4.500)

## 2020-06-28 LAB — LIPID PANEL
Chol/HDL Ratio: 4.5 ratio (ref 0.0–5.0)
Cholesterol, Total: 150 mg/dL (ref 100–199)
HDL: 33 mg/dL — ABNORMAL LOW (ref >39)
LDL Chol Calc (NIH): 94 mg/dL (ref 0–99)
Triglycerides: 128 mg/dL (ref 0–149)
VLDL Cholesterol Cal: 23 mg/dL (ref 5–40)

## 2020-06-28 LAB — PSA, TOTAL AND FREE
PSA, Free Pct: 34 %
PSA, Free: 0.17 ng/mL
Prostate Specific Ag, Serum: 0.5 ng/mL (ref 0.0–4.0)

## 2020-06-28 LAB — CBC WITH DIFFERENTIAL/PLATELET
Basophils Absolute: 0 10*3/uL (ref 0.0–0.2)
Basos: 1 %
EOS (ABSOLUTE): 0.1 10*3/uL (ref 0.0–0.4)
Eos: 2 %
Hematocrit: 47.8 % (ref 37.5–51.0)
Hemoglobin: 16.2 g/dL (ref 13.0–17.7)
Immature Grans (Abs): 0.1 10*3/uL (ref 0.0–0.1)
Immature Granulocytes: 1 %
Lymphocytes Absolute: 1.9 10*3/uL (ref 0.7–3.1)
Lymphs: 23 %
MCH: 27.8 pg (ref 26.6–33.0)
MCHC: 33.9 g/dL (ref 31.5–35.7)
MCV: 82 fL (ref 79–97)
Monocytes Absolute: 0.6 10*3/uL (ref 0.1–0.9)
Monocytes: 8 %
Neutrophils Absolute: 5.3 10*3/uL (ref 1.4–7.0)
Neutrophils: 65 %
Platelets: 259 10*3/uL (ref 150–450)
RBC: 5.83 x10E6/uL — ABNORMAL HIGH (ref 4.14–5.80)
RDW: 13.7 % (ref 11.6–15.4)
WBC: 8.1 10*3/uL (ref 3.4–10.8)

## 2020-06-28 LAB — TESTOSTERONE,FREE AND TOTAL
Testosterone, Free: 15.6 pg/mL (ref 7.2–24.0)
Testosterone: 367 ng/dL (ref 264–916)

## 2020-09-26 ENCOUNTER — Encounter: Payer: Self-pay | Admitting: Family Medicine

## 2020-09-26 ENCOUNTER — Other Ambulatory Visit: Payer: Self-pay

## 2020-09-26 ENCOUNTER — Ambulatory Visit (INDEPENDENT_AMBULATORY_CARE_PROVIDER_SITE_OTHER): Payer: 59 | Admitting: Family Medicine

## 2020-09-26 VITALS — BP 144/77 | HR 112 | Temp 97.0°F | Resp 20 | Ht 73.0 in | Wt 388.0 lb

## 2020-09-26 DIAGNOSIS — M79671 Pain in right foot: Secondary | ICD-10-CM | POA: Diagnosis not present

## 2020-09-26 MED ORDER — DOXYCYCLINE HYCLATE 100 MG PO TABS: 100.0000 mg | ORAL_TABLET | Freq: Two times a day (BID) | ORAL | 0 refills | Status: AC

## 2020-09-26 MED ORDER — MELOXICAM 15 MG PO TABS: 15.0000 mg | ORAL_TABLET | Freq: Every day | ORAL | 0 refills | Status: DC

## 2020-09-26 NOTE — Progress Notes (Signed)
Subjective: CC: right foot pain PCP: Loman Brooklyn, FNP  FMB:[id number] Francisco Adams is a 50 y.o. male presenting to clinic today for:  1. Right foot pain Francisco Adams reports swelling and pain of his right foot x 1 week. It feels like the skin hurts. He reports that the pain feels like a burning, pulling, throbbing pain. It is worse movement or if something touches the skin. Pain ranges from 3-7/10. He has had clindamycin at home from a previous dental abscess that he has been taking 3-4x a day for the last 6 days. He has had the antibiotic for about 1.5 years. He does report some improvement in his pain and swelling overall. He does not have a history of gout. Denies redness, fever, body aches, chills, injury or trauma. He has had surgery on his right ankle before, about 20 years ago. He has taken ibuprophen without improvement.   Relevant past medical, surgical, family, and social history reviewed and updated as indicated.  Allergies and medications reviewed and updated.  No Known Allergies Past Medical History:  Diagnosis Date  . Arthritis   . Dermatitis    head  and face   . Palpitations   . Sleep apnea    cpap   . Testosterone deficiency     Current Outpatient Medications:  .  clotrimazole-betamethasone (LOTRISONE) cream, Apply 1 application topically 2 (two) times daily., Disp: 30 g, Rfl: 3 .  fexofenadine (ALLEGRA) 180 MG tablet, Take 1 tablet (180 mg total) by mouth daily., Disp: 90 tablet, Rfl: 3 .  metoprolol succinate (TOPROL-XL) 25 MG 24 hr tablet, Take 1 tablet (25 mg total) by mouth daily., Disp: 90 tablet, Rfl: 3 .  olmesartan (BENICAR) 20 MG tablet, Take 1 tablet (20 mg total) by mouth daily., Disp: 90 tablet, Rfl: 3 .  testosterone cypionate (DEPOTESTOSTERONE CYPIONATE) 200 MG/ML injection, Inject 0.5 mLs (100 mg total) into the muscle every 14 (fourteen) days., Disp: 10 mL, Rfl: 1 .  sildenafil (VIAGRA) 100 MG tablet, Take 1 tablet (100 mg total) by mouth daily as needed for  erectile dysfunction., Disp: 30 tablet, Rfl: 2 Social History   Socioeconomic History  . Marital status: Divorced    Spouse name: Not on file  . Number of children: 3  . Years of education: Not on file  . Highest education level: Not on file  Occupational History  . Not on file  Tobacco Use  . Smoking status: Former Research scientist (life sciences)  . Smokeless tobacco: Current User    Types: Chew  Vaping Use  . Vaping Use: Never used  Substance and Sexual Activity  . Alcohol use: Never  . Drug use: Never  . Sexual activity: Not on file  Other Topics Concern  . Not on file  Social History Narrative  . Not on file   Social Determinants of Health   Financial Resource Strain: Not on file  Food Insecurity: Not on file  Transportation Needs: Not on file  Physical Activity: Not on file  Stress: Not on file  Social Connections: Not on file  Intimate Partner Violence: Not on file   Family History  Problem Relation Age of Onset  . Alcohol abuse Mother   . Hypertension Mother   . Heart attack Mother   . Alcohol abuse Father   . Hypertension Father   . Heart attack Father   . Seizures Brother   . AAA (abdominal aortic aneurysm) Brother   . Drug abuse Brother     Review of  Systems  As per HPI.  Objective: Office vital signs reviewed. BP (!) 144/77   Pulse (!) 112   Temp (!) 97 F (36.1 C)   Resp 20   Ht 6' 1"  (1.854 m)   Wt (!) 388 lb (176 kg)   SpO2 96%   BMI 51.19 kg/m   Physical Examination:  Physical Exam Vitals and nursing note reviewed.  Constitutional:      General: He is not in acute distress.    Appearance: Normal appearance. He is not ill-appearing, toxic-appearing or diaphoretic.  Feet:     Comments: Right foot: non-pitting edema present. Diffuse TTP. No erythema or skin breakdown. Full ROM, sensation intact.  Skin:    Capillary Refill: Capillary refill takes less than 2 seconds.  Neurological:     General: No focal deficit present.     Mental Status: He is alert and  oriented to person, place, and time.     Gait: Gait normal.  Psychiatric:        Mood and Affect: Mood normal.        Behavior: Behavior normal.        Thought Content: Thought content normal.        Judgment: Judgment normal.      Results for orders placed or performed in visit on 06/24/20  CBC with Differential/Platelet  Result Value Ref Range   WBC 8.1 3.4 - 10.8 x10E3/uL   RBC 5.83 (H) 4.14 - 5.80 x10E6/uL   Hemoglobin 16.2 13.0 - 17.7 g/dL   Hematocrit 47.8 37.5 - 51.0 %   MCV 82 79 - 97 fL   MCH 27.8 26.6 - 33.0 pg   MCHC 33.9 31.5 - 35.7 g/dL   RDW 13.7 11.6 - 15.4 %   Platelets 259 150 - 450 x10E3/uL   Neutrophils 65 Not Estab. %   Lymphs 23 Not Estab. %   Monocytes 8 Not Estab. %   Eos 2 Not Estab. %   Basos 1 Not Estab. %   Neutrophils Absolute 5.3 1.4 - 7.0 x10E3/uL   Lymphocytes Absolute 1.9 0.7 - 3.1 x10E3/uL   Monocytes Absolute 0.6 0.1 - 0.9 x10E3/uL   EOS (ABSOLUTE) 0.1 0.0 - 0.4 x10E3/uL   Basophils Absolute 0.0 0.0 - 0.2 x10E3/uL   Immature Granulocytes 1 Not Estab. %   Immature Grans (Abs) 0.1 0.0 - 0.1 x10E3/uL  CMP14+EGFR  Result Value Ref Range   Glucose 106 (H) 65 - 99 mg/dL   BUN 14 6 - 24 mg/dL   Creatinine, Ser 0.88 0.76 - 1.27 mg/dL   GFR calc non Af Amer 99 >59 mL/min/1.73   GFR calc Af Amer 115 >59 mL/min/1.73   BUN/Creatinine Ratio 16 9 - 20   Sodium 137 134 - 144 mmol/L   Potassium 4.0 3.5 - 5.2 mmol/L   Chloride 102 96 - 106 mmol/L   CO2 21 20 - 29 mmol/L   Calcium 8.9 8.7 - 10.2 mg/dL   Total Protein 7.0 6.0 - 8.5 g/dL   Albumin 4.3 3.8 - 4.9 g/dL   Globulin, Total 2.7 1.5 - 4.5 g/dL   Albumin/Globulin Ratio 1.6 1.2 - 2.2   Bilirubin Total <0.2 0.0 - 1.2 mg/dL   Alkaline Phosphatase 70 44 - 121 IU/L   AST 18 0 - 40 IU/L   ALT 24 0 - 44 IU/L  Lipid panel  Result Value Ref Range   Cholesterol, Total 150 100 - 199 mg/dL   Triglycerides 128 0 - 149 mg/dL  HDL 33 (L) >39 mg/dL   VLDL Cholesterol Cal 23 5 - 40 mg/dL   LDL Chol  Calc (NIH) 94 0 - 99 mg/dL   Chol/HDL Ratio 4.5 0.0 - 5.0 ratio  Testosterone,Free and Total  Result Value Ref Range   Testosterone 367 264 - 916 ng/dL   Testosterone, Free 15.6 7.2 - 24.0 pg/mL  TSH  Result Value Ref Range   TSH 2.240 0.450 - 4.500 uIU/mL  PSA, total and free  Result Value Ref Range   Prostate Specific Ag, Serum 0.5 0.0 - 4.0 ng/mL   PSA, Free 0.17 N/A ng/mL   PSA, Free Pct 34.0 %     Assessment/ Plan: Laroy was seen today for foot injury.  Diagnoses and all orders for this visit:  Right foot pain ? Gout vs cellulitis. Start Doxycycline. Mobic given. Do not take other NAIDs with mobic. Uric acid level pending. Return to office for new or worsening symptoms, or if symptoms persist.  -     Uric Acid -     meloxicam (MOBIC) 15 MG tablet; Take 1 tablet (15 mg total) by mouth daily. -     doxycycline (VIBRA-TABS) 100 MG tablet; Take 1 tablet (100 mg total) by mouth 2 (two) times daily for 10 days. 1 po bid  Follow up as needed.   The above assessment and management plan was discussed with the patient. The patient verbalized understanding of and has agreed to the management plan. Patient is aware to call the clinic if symptoms persist or worsen. Patient is aware when to return to the clinic for a follow-up visit. Patient educated on when it is appropriate to go to the emergency department.   Marjorie Smolder, FNP-C [facility] [address] [place], [place] [address] [phone]

## 2020-09-26 NOTE — Patient Instructions (Signed)
Cellulitis, Adult  Cellulitis is a skin infection. The infected area is usually warm, red, swollen, and tender. This condition occurs most often in the arms and lower legs. The infection can travel to the muscles, blood, and underlying tissue and become serious. It is very important to get treated for this condition. What are the causes? Cellulitis is caused by bacteria. The bacteria enter through a break in the skin, such as a cut, burn, insect bite, open sore, or crack. What increases the risk? This condition is more likely to occur in people who:  Have a weak body defense system (immune system).  Have open wounds on the skin, such as cuts, burns, bites, and scrapes. Bacteria can enter the body through these open wounds.  Are older than 52 years of age.  Have diabetes.  Have a type of long-lasting (chronic) liver disease (cirrhosis) or kidney disease.  Are obese.  Have a skin condition such as: ? Itchy rash (eczema). ? Slow movement of blood in the veins (venous stasis). ? Fluid buildup below the skin (edema).  Have had radiation therapy.  Use IV drugs. What are the signs or symptoms? Symptoms of this condition include:  Redness, streaking, or spotting on the skin.  Swollen area of the skin.  Tenderness or pain when an area of the skin is touched.  Warm skin.  A fever.  Chills.  Blisters. How is this diagnosed? This condition is diagnosed based on a medical history and physical exam. You may also have tests, including:  Blood tests.  Imaging tests. How is this treated? Treatment for this condition may include:  Medicines, such as antibiotic medicines or medicines to treat allergies (antihistamines).  Supportive care, such as rest and application of cold or warm cloths (compresses) to the skin.  Hospital care, if the condition is severe. The infection usually starts to get better within 1-2 days of treatment. Follow these instructions at  home:  Medicines  Take over-the-counter and prescription medicines only as told by your health care provider.  If you were prescribed an antibiotic medicine, take it as told by your health care provider. Do not stop taking the antibiotic even if you start to feel better. General instructions  Drink enough fluid to keep your urine pale yellow.  Do not touch or rub the infected area.  Raise (elevate) the infected area above the level of your heart while you are sitting or lying down.  Apply warm or cold compresses to the affected area as told by your health care provider.  Keep all follow-up visits as told by your health care provider. This is important. These visits let your health care provider make sure a more serious infection is not developing. Contact a health care provider if:  You have a fever.  Your symptoms do not begin to improve within 1-2 days of starting treatment.  Your bone or joint underneath the infected area becomes painful after the skin has healed.  Your infection returns in the same area or another area.  You notice a swollen bump in the infected area.  You develop new symptoms.  You have a general ill feeling (malaise) with muscle aches and pains. Get help right away if:  Your symptoms get worse.  You feel very sleepy.  You develop vomiting or diarrhea that persists.  You notice red streaks coming from the infected area.  Your red area gets larger or turns dark in color. These symptoms may represent a serious problem that is an   emergency. Do not wait to see if the symptoms will go away. Get medical help right away. Call your local emergency services (911 in the [place] Do not drive yourself to the hospital. Summary  Cellulitis is a skin infection. This condition occurs most often in the arms and lower legs.  Treatment for this condition may include medicines, such as antibiotic medicines or antihistamines.  Take over-the-counter and prescription  medicines only as told by your health care provider. If you were prescribed an antibiotic medicine, do not stop taking the antibiotic even if you start to feel better.  Contact a health care provider if your symptoms do not begin to improve within 1-2 days of starting treatment or your symptoms get worse.  Keep all follow-up visits as told by your health care provider. This is important. These visits let your health care provider make sure that a more serious infection is not developing. This information is not intended to replace advice given to you by your health care provider. Make sure you discuss any questions you have with your health care provider. Document Revised: 01/30/2018 Document Reviewed: 01/30/2018 Elsevier Patient Education  2020 [employer]    Gout  Gout is a condition that causes painful swelling of the joints. Gout is a type of inflammation of the joints (arthritis). This condition is caused by having too much uric acid in the body. Uric acid is a chemical that forms when the body breaks down substances called purines. Purines are important for building body proteins. When the body has too much uric acid, sharp crystals can form and build up inside the joints. This causes pain and swelling. Gout attacks can happen quickly and may be very painful (acute gout). Over time, the attacks can affect more joints and become more frequent (chronic gout). Gout can also cause uric acid to build up under the skin and inside the kidneys. What are the causes? This condition is caused by too much uric acid in your blood. This can happen because:  Your kidneys do not remove enough uric acid from your blood. This is the most common cause.  Your body makes too much uric acid. This can happen with some cancers and cancer treatments. It can also occur if your body is breaking down too many red blood cells (hemolytic anemia).  You eat too many foods that are high in purines. These foods include  organ meats and some seafood. Alcohol, especially beer, is also high in purines. A gout attack may be triggered by trauma or stress. What increases the risk? You are more likely to develop this condition if you:  Have a family history of gout.  Are male and middle-aged.  Are male and have gone through menopause.  Are obese.  Frequently drink alcohol, especially beer.  Are dehydrated.  Lose weight too quickly.  Have an organ transplant.  Have lead poisoning.  Take certain medicines, including aspirin, cyclosporine, diuretics, levodopa, and niacin.  Have kidney disease.  Have a skin condition called psoriasis. What are the signs or symptoms? An attack of acute gout happens quickly. It usually occurs in just one joint. The most common place is the big toe. Attacks often start at night. Other joints that may be affected include joints of the feet, ankle, knee, fingers, wrist, or elbow. Symptoms of this condition may include:  Severe pain.  Warmth.  Swelling.  Stiffness.  Tenderness. The affected joint may be very painful to touch.  Shiny, red, or purple skin.    Chills and fever. Chronic gout may cause symptoms more frequently. More joints may be involved. You may also have white or yellow lumps (tophi) on your hands or feet or in other areas near your joints. How is this diagnosed? This condition is diagnosed based on your symptoms, medical history, and physical exam. You may have tests, such as:  Blood tests to measure uric acid levels.  Removal of joint fluid with a thin needle (aspiration) to look for uric acid crystals.  X-rays to look for joint damage. How is this treated? Treatment for this condition has two phases: treating an acute attack and preventing future attacks. Acute gout treatment may include medicines to reduce pain and swelling, including:  NSAIDs.  Steroids. These are strong anti-inflammatory medicines that can be taken by mouth (orally) or  injected into a joint.  Colchicine. This medicine relieves pain and swelling when it is taken soon after an attack. It can be given by mouth or through an IV. Preventive treatment may include:  Daily use of smaller doses of NSAIDs or colchicine.  Use of a medicine that reduces uric acid levels in your blood.  Changes to your diet. You may need to see a dietitian about what to eat and drink to prevent gout. Follow these instructions at home: During a gout attack   If directed, put ice on the affected area: ? Put ice in a plastic bag. ? Place a towel between your skin and the bag. ? Leave the ice on for 20 minutes, 2-3 times a day.  Raise (elevate) the affected joint above the level of your heart as often as possible.  Rest the joint as much as possible. If the affected joint is in your leg, you may be given crutches to use.  Follow instructions from your health care provider about eating or drinking restrictions. Avoiding future gout attacks  Follow a low-purine diet as told by your dietitian or health care provider. Avoid foods and drinks that are high in purines, including liver, kidney, anchovies, asparagus, herring, mushrooms, mussels, and beer.  Maintain a healthy weight or lose weight if you are overweight. If you want to lose weight, talk with your health care provider. It is important that you do not lose weight too quickly.  Start or maintain an exercise program as told by your health care provider. Eating and drinking  Drink enough fluids to keep your urine pale yellow.  If you drink alcohol: ? Limit how much you use to:  0-1 drink a day for women.  0-2 drinks a day for men. ? Be aware of how much alcohol is in your drink. In the [place] one drink equals one 12 oz bottle of beer (355 mL) one 5 oz glass of wine (148 mL), or one 1 oz glass of hard liquor (44 mL). General instructions  Take over-the-counter and prescription medicines only as told by your health care  provider.  Do not drive or use heavy machinery while taking prescription pain medicine.  Return to your normal activities as told by your health care provider. Ask your health care provider what activities are safe for you.  Keep all follow-up visits as told by your health care provider. This is important. Contact a health care provider if you have:  Another gout attack.  Continuing symptoms of a gout attack after 10 days of treatment.  Side effects from your medicines.  Chills or a fever.  Burning pain when you urinate.  Pain in your lower   back or belly. Get help right away if you:  Have severe or uncontrolled pain.  Cannot urinate. Summary  Gout is painful swelling of the joints caused by inflammation.  The most common site of pain is the big toe, but it can affect other joints in the body.  Medicines and dietary changes can help to prevent and treat gout attacks. This information is not intended to replace advice given to you by your health care provider. Make sure you discuss any questions you have with your health care provider. Document Revised: 04/02/2018 Document Reviewed: 04/02/2018 Elsevier Patient Education  2020 [employer]  

## 2020-09-27 LAB — URIC ACID: Uric Acid: 6.3 mg/dL (ref 3.8–8.4)

## 2020-10-02 ENCOUNTER — Other Ambulatory Visit: Payer: Self-pay | Admitting: Family Medicine

## 2020-10-02 DIAGNOSIS — I10 Essential (primary) hypertension: Secondary | ICD-10-CM

## 2020-10-07 ENCOUNTER — Ambulatory Visit: Payer: 59 | Admitting: Nurse Practitioner

## 2020-10-14 ENCOUNTER — Encounter: Payer: Self-pay | Admitting: Nurse Practitioner

## 2020-10-14 ENCOUNTER — Ambulatory Visit (INDEPENDENT_AMBULATORY_CARE_PROVIDER_SITE_OTHER): Payer: 59 | Admitting: Nurse Practitioner

## 2020-10-14 ENCOUNTER — Other Ambulatory Visit: Payer: Self-pay

## 2020-10-14 VITALS — BP 145/71 | HR 49 | Temp 98.6°F

## 2020-10-14 DIAGNOSIS — R0989 Other specified symptoms and signs involving the circulatory and respiratory systems: Secondary | ICD-10-CM | POA: Diagnosis not present

## 2020-10-14 DIAGNOSIS — R059 Cough, unspecified: Secondary | ICD-10-CM | POA: Diagnosis not present

## 2020-10-14 MED ORDER — PREDNISONE 10 MG (21) PO TBPK: ORAL_TABLET | ORAL | 0 refills | Status: DC

## 2020-10-14 MED ORDER — DM-GUAIFENESIN ER 30-600 MG PO TB12: 1.0000 | ORAL_TABLET | Freq: Two times a day (BID) | ORAL | 0 refills | Status: DC

## 2020-10-14 MED ORDER — AZITHROMYCIN 250 MG PO TABS: ORAL_TABLET | ORAL | 0 refills | Status: DC

## 2020-10-14 MED ORDER — ACETAMINOPHEN 500 MG PO TABS: 500.0000 mg | ORAL_TABLET | Freq: Four times a day (QID) | ORAL | 0 refills | Status: DC | PRN

## 2020-10-14 NOTE — Assessment & Plan Note (Signed)
New worsening symptom of cough in the last 24 hours. patient was exposed to COVID-19 at work.   Covid-19 swab completed- results pending.  Started patient on prednisone, azithromycin, Tylenol for fever.  Advised patient to increase hydration, seek emergency care with uncontrolled fever and oxygen saturations below 91%. Education provided with printed handouts given. Rx sent to pharmacy

## 2020-10-14 NOTE — Assessment & Plan Note (Signed)
This is new in the last 24 hours patient exposed to COVID-19 at work.  Completed COVID-19 swab results pending. Guaifenesin ordered for cough and congestion. Rx sent to pharmacy.  Follow-up with worsening or unresolved symptoms

## 2020-10-14 NOTE — Progress Notes (Signed)
Acute Office Visit  Subjective:    Patient ID: Francisco Adams, male    DOB: 1971/06/22, 50 y.o.   MRN: [id number]  Chief Complaint  Patient presents with  . Nasal Congestion    Chills   . Fever  . Chills  . Cough    Fever  This is a new problem. The current episode started yesterday. The problem occurs constantly. The problem has been unchanged. The maximum temperature noted was 100 to 100.9 F. Associated symptoms include coughing. Pertinent negatives include no abdominal pain, ear pain or sore throat. He has tried nothing for the symptoms.  Cough This is a new problem. The current episode started yesterday. The problem has been unchanged. The cough is non-productive. Associated symptoms include a fever and nasal congestion. Pertinent negatives include no ear pain or sore throat. Nothing aggravates the symptoms. He has tried nothing for the symptoms.     Past Medical History:  Diagnosis Date  . Arthritis   . Dermatitis    head  and face   . Palpitations   . Sleep apnea    cpap   . Testosterone deficiency     Past Surgical History:  Procedure Laterality Date  . ANKLE RECONSTRUCTION Right   . SPINE SURGERY     cervical - 3 disc fusions    Family History  Problem Relation Age of Onset  . Alcohol abuse Mother   . Hypertension Mother   . Heart attack Mother   . Alcohol abuse Father   . Hypertension Father   . Heart attack Father   . Seizures Brother   . AAA (abdominal aortic aneurysm) Brother   . Drug abuse Brother     Social History   Socioeconomic History  . Marital status: Divorced    Spouse name: Not on file  . Number of children: 3  . Years of education: Not on file  . Highest education level: Not on file  Occupational History  . Not on file  Tobacco Use  . Smoking status: Former Games developer  . Smokeless tobacco: Current User    Types: Chew  Vaping Use  . Vaping Use: Never used  Substance and Sexual Activity  . Alcohol use: Never  . Drug use: Never   . Sexual activity: Not on file  Other Topics Concern  . Not on file  Social History Narrative  . Not on file   Social Determinants of Health   Financial Resource Strain: Not on file  Food Insecurity: Not on file  Transportation Needs: Not on file  Physical Activity: Not on file  Stress: Not on file  Social Connections: Not on file  Intimate Partner Violence: Not on file    Outpatient Medications Prior to Visit  Medication Sig Dispense Refill  . clotrimazole-betamethasone (LOTRISONE) cream Apply 1 application topically 2 (two) times daily. 30 g 3  . fexofenadine (ALLEGRA) 180 MG tablet Take 1 tablet (180 mg total) by mouth daily. 90 tablet 3  . meloxicam (MOBIC) 15 MG tablet Take 1 tablet (15 mg total) by mouth daily. 30 tablet 0  . metoprolol succinate (TOPROL-XL) 25 MG 24 hr tablet Take 1 tablet by mouth once daily 90 tablet 0  . olmesartan (BENICAR) 20 MG tablet Take 1 tablet by mouth once daily 90 tablet 0  . sildenafil (VIAGRA) 100 MG tablet Take 1 tablet (100 mg total) by mouth daily as needed for erectile dysfunction. 30 tablet 2  . testosterone cypionate (DEPOTESTOSTERONE CYPIONATE) 200 MG/ML injection  Inject 0.5 mLs (100 mg total) into the muscle every 14 (fourteen) days. 10 mL 1   No facility-administered medications prior to visit.     Review of Systems  Constitutional: Positive for fever.  HENT: Negative for ear pain and sore throat.   Respiratory: Positive for cough.   Gastrointestinal: Negative for abdominal pain.  Genitourinary: Negative.   Musculoskeletal: Negative.   Skin:       Flushed   Psychiatric/Behavioral: Negative.   All other systems reviewed and are negative.      Objective:    Physical Exam Vitals reviewed.  Constitutional:      Appearance: Normal appearance. He is obese.  HENT:     Head: Normocephalic.     Nose: Congestion present.  Eyes:     Conjunctiva/sclera: Conjunctivae normal.  Cardiovascular:     Rate and Rhythm: Normal rate  and regular rhythm.     Pulses: Normal pulses.  Pulmonary:     Effort: Pulmonary effort is normal.     Breath sounds: Normal breath sounds.  Abdominal:     General: Bowel sounds are normal.  Musculoskeletal:        General: Normal range of motion.  Skin:    General: Skin is warm.  Neurological:     Mental Status: He is alert and oriented to person, place, and time.  Psychiatric:        Mood and Affect: Mood normal.        Behavior: Behavior normal.     BP (!) 145/71   Pulse (!) 49   Temp 98.6 F (37 C)   SpO2 96%  Wt Readings from Last 3 Encounters:  09/26/20 (!) 388 lb (176 kg)  06/24/20 (!) 385 lb 3.2 oz (174.7 kg)  04/18/20 (!) 385 lb 6.4 oz (174.8 kg)    Health Maintenance Due  Topic Date Due  . COVID-19 Vaccine (1) Never done  . Fecal DNA (Cologuard)  Never done  . TETANUS/TDAP  08/30/2020    There are no preventive care reminders to display for this patient.   Lab Results  Component Value Date   TSH 2.240 06/24/2020   Lab Results  Component Value Date   WBC 8.1 06/24/2020   HGB 16.2 06/24/2020   HCT 47.8 06/24/2020   MCV 82 06/24/2020   PLT 259 06/24/2020   Lab Results  Component Value Date   NA 137 06/24/2020   K 4.0 06/24/2020   CO2 21 06/24/2020   GLUCOSE 106 (H) 06/24/2020   BUN 14 06/24/2020   CREATININE 0.88 06/24/2020   BILITOT <0.2 06/24/2020   ALKPHOS 70 06/24/2020   AST 18 06/24/2020   ALT 24 06/24/2020   PROT 7.0 06/24/2020   ALBUMIN 4.3 06/24/2020   CALCIUM 8.9 06/24/2020   Lab Results  Component Value Date   CHOL 150 06/24/2020   Lab Results  Component Value Date   HDL 33 (L) 06/24/2020   Lab Results  Component Value Date   LDLCALC 94 06/24/2020   Lab Results  Component Value Date   TRIG 128 06/24/2020   Lab Results  Component Value Date   CHOLHDL 4.5 06/24/2020   No results found for: HGBA1C     Assessment & Plan:  Cough New worsening symptom of cough in the last 24 hours. patient was exposed to COVID-19  at work.   Covid-19 swab completed- results pending.  Started patient on prednisone, azithromycin, Tylenol for fever.  Advised patient to increase hydration, seek emergency care  with uncontrolled fever and oxygen saturations below 91%. Education provided with printed handouts given. Rx sent to pharmacy  Chest congestion This is new in the last 24 hours patient exposed to COVID-19 at work.  Completed COVID-19 swab results pending. Guaifenesin ordered for cough and congestion. Rx sent to pharmacy.  Follow-up with worsening or unresolved symptoms    Problem List Items Addressed This Visit   None   Visit Diagnoses    Cough    -  Primary   Relevant Orders   Novel Coronavirus, NAA ([facility])       Meds ordered this encounter  Medications  . predniSONE (STERAPRED UNI-PAK 21 TAB) 10 MG (21) TBPK tablet    Sig: 6 tablet day 1, 5 tablet day 2, 4 tablet day 3,3 tablet 4, 2 tablet day 5, 1 tablet day 6    Dispense:  1 each    Refill:  0    Order Specific Question:   Supervising Provider    Answer:   Raliegh Ip [[id number]]  . azithromycin (ZITHROMAX) 250 MG tablet    Sig: 500 mg tablet (day 1), 250 mg tablet (day 2-5)    Dispense:  6 tablet    Refill:  0    Order Specific Question:   Supervising Provider    Answer:   Raliegh Ip [[id number]]  . dextromethorphan-guaiFENesin (MUCINEX DM) 30-600 MG 12hr tablet    Sig: Take 1 tablet by mouth 2 (two) times daily.    Dispense:  30 tablet    Refill:  0    Order Specific Question:   Supervising Provider    Answer:   Raliegh Ip [[id number]]  . acetaminophen (TYLENOL) 500 MG tablet    Sig: Take 1 tablet (500 mg total) by mouth every 6 (six) hours as needed.    Dispense:  30 tablet    Refill:  0    Order Specific Question:   Supervising Provider    Answer:   Raliegh Ip [[id number]]     Daryll Drown, NP

## 2020-10-14 NOTE — Patient Instructions (Signed)

## 2020-10-17 ENCOUNTER — Telehealth: Payer: Self-pay

## 2020-10-17 LAB — SPECIMEN STATUS REPORT

## 2020-10-17 LAB — NOVEL CORONAVIRUS, NAA: SARS-CoV-2, NAA: DETECTED — AB

## 2020-10-18 ENCOUNTER — Other Ambulatory Visit: Payer: Self-pay | Admitting: Nurse Practitioner

## 2020-10-18 ENCOUNTER — Telehealth: Payer: Self-pay | Admitting: Family Medicine

## 2020-10-18 NOTE — Telephone Encounter (Signed)
His work note is ready for pick up at front office.

## 2020-10-18 NOTE — Telephone Encounter (Signed)
Pt would like a note for work to return 1/31 - he was seen by Je 1/21 in COVID clinic

## 2020-10-18 NOTE — Telephone Encounter (Signed)
There is a typo with the return to work date. Can someone fix and reprint and put up front for pt to pick up after hes done quarantining.

## 2020-10-19 NOTE — Telephone Encounter (Signed)
Letter fixed and up front - pt aware to have someone pick up for him

## 2020-10-19 NOTE — Telephone Encounter (Signed)
Aware. 

## 2020-10-21 ENCOUNTER — Other Ambulatory Visit: Payer: Self-pay | Admitting: Nurse Practitioner

## 2020-10-21 ENCOUNTER — Encounter: Payer: Self-pay | Admitting: Family Medicine

## 2020-10-21 ENCOUNTER — Telehealth: Payer: Self-pay | Admitting: Family Medicine

## 2020-10-21 DIAGNOSIS — R110 Nausea: Secondary | ICD-10-CM | POA: Insufficient documentation

## 2020-10-21 MED ORDER — ONDANSETRON HCL 4 MG PO TABS: 4.0000 mg | ORAL_TABLET | Freq: Three times a day (TID) | ORAL | 0 refills | Status: DC | PRN

## 2020-10-21 NOTE — Telephone Encounter (Signed)
I will send Zofran to help managed Nausea symptoms, tylenol for headache and fever, patient will need chest x-ray to rule out Pneumonia. Please keep hydrated and monitor oxygen saturation. I will advice patient to seek emergency care at this time or schedule an appointment for covid clinic ASAP with this changing symptoms.

## 2020-10-21 NOTE — Telephone Encounter (Signed)
Pt was seen in COVID clinic by Je on 1/21 is still having symptoms (fever, congestion and cough) - cough and fever have gotten worse according to fiance. New symptoms are dizzy and nausea. Pt is also on bp meds and fiance stated that it has been a little high as well.

## 2020-10-21 NOTE — Telephone Encounter (Signed)
Patient aware and No COVID clinic appts available until Monday 1/31

## 2020-11-04 ENCOUNTER — Other Ambulatory Visit: Payer: Self-pay

## 2020-11-04 ENCOUNTER — Telehealth: Payer: Self-pay

## 2020-11-04 ENCOUNTER — Ambulatory Visit (INDEPENDENT_AMBULATORY_CARE_PROVIDER_SITE_OTHER): Payer: 59 | Admitting: Nurse Practitioner

## 2020-11-04 ENCOUNTER — Encounter: Payer: Self-pay | Admitting: Nurse Practitioner

## 2020-11-04 VITALS — BP 140/86 | HR 95 | Temp 98.3°F | Ht 73.0 in | Wt 376.2 lb

## 2020-11-04 DIAGNOSIS — R600 Localized edema: Secondary | ICD-10-CM | POA: Diagnosis not present

## 2020-11-04 MED ORDER — FUROSEMIDE 20 MG PO TABS
20.0000 mg | ORAL_TABLET | Freq: Every day | ORAL | 0 refills | Status: DC
Start: 2020-11-04 — End: 2021-01-30

## 2020-11-04 NOTE — Progress Notes (Signed)
Acute Office Visit  Subjective:    Patient ID: Francisco Adams, male    DOB: [birth year], 50 y.o.   MRN: [id number]  Chief Complaint  Patient presents with  . Foot Swelling    HPI Patient is in a 50 year old male presents for bilateral lower extremity edema with right leg greater than left.  Worsening edema, with mild soreness, tingling and slight numbness.  This is not new as patient uses compression socks and elevation of extremities to help relieve edema.  Patient has a history of right leg surgery in 2020.  Leg is not warm to touch, skin is not erythematous.  Past Medical History:  Diagnosis Date  . Arthritis   . Dermatitis    head  and face   . Palpitations   . Sleep apnea    cpap   . Testosterone deficiency     Past Surgical History:  Procedure Laterality Date  . ANKLE RECONSTRUCTION Right   . SPINE SURGERY     cervical - 3 disc fusions    Family History  Problem Relation Age of Onset  . Alcohol abuse Mother   . Hypertension Mother   . Heart attack Mother   . Alcohol abuse Father   . Hypertension Father   . Heart attack Father   . Seizures Brother   . AAA (abdominal aortic aneurysm) Brother   . Drug abuse Brother     Social History   Socioeconomic History  . Marital status: Divorced    Spouse name: Not on file  . Number of children: 3  . Years of education: Not on file  . Highest education level: Not on file  Occupational History  . Not on file  Tobacco Use  . Smoking status: Former Games developer  . Smokeless tobacco: Current User    Types: Chew  Vaping Use  . Vaping Use: Never used  Substance and Sexual Activity  . Alcohol use: Never  . Drug use: Never  . Sexual activity: Not on file  Other Topics Concern  . Not on file  Social History Narrative  . Not on file   Social Determinants of Health   Financial Resource Strain: Not on file  Food Insecurity: Not on file  Transportation Needs: Not on file  Physical Activity: Not on file  Stress: Not  on file  Social Connections: Not on file  Intimate Partner Violence: Not on file    Outpatient Medications Prior to Visit  Medication Sig Dispense Refill  . acetaminophen (TYLENOL) 500 MG tablet Take 1 tablet (500 mg total) by mouth every 6 (six) hours as needed. 30 tablet 0  . clotrimazole-betamethasone (LOTRISONE) cream Apply 1 application topically 2 (two) times daily. 30 g 3  . dextromethorphan-guaiFENesin (MUCINEX DM) 30-600 MG 12hr tablet Take 1 tablet by mouth 2 (two) times daily. 30 tablet 0  . fexofenadine (ALLEGRA) 180 MG tablet Take 1 tablet (180 mg total) by mouth daily. 90 tablet 3  . meloxicam (MOBIC) 15 MG tablet Take 1 tablet (15 mg total) by mouth daily. 30 tablet 0  . metoprolol succinate (TOPROL-XL) 25 MG 24 hr tablet Take 1 tablet by mouth once daily 90 tablet 0  . olmesartan (BENICAR) 20 MG tablet Take 1 tablet by mouth once daily 90 tablet 0  . ondansetron (ZOFRAN) 4 MG tablet Take 1 tablet (4 mg total) by mouth every 8 (eight) hours as needed for nausea or vomiting. 20 tablet 0  . sildenafil (VIAGRA) 100 MG tablet Take  1 tablet (100 mg total) by mouth daily as needed for erectile dysfunction. 30 tablet 2  . testosterone cypionate (DEPOTESTOSTERONE CYPIONATE) 200 MG/ML injection Inject 0.5 mLs (100 mg total) into the muscle every 14 (fourteen) days. 10 mL 1  . azithromycin (ZITHROMAX) 250 MG tablet 500 mg tablet (day 1), 250 mg tablet (day 2-5) 6 tablet 0  . predniSONE (STERAPRED UNI-PAK 21 TAB) 10 MG (21) TBPK tablet 6 tablet day 1, 5 tablet day 2, 4 tablet day 3,3 tablet 4, 2 tablet day 5, 1 tablet day 6 1 each 0   No facility-administered medications prior to visit.    No Known Allergies  Review of Systems  Constitutional: Negative.   HENT: Negative.   Eyes: Negative.   Respiratory: Negative.   Cardiovascular: Positive for leg swelling.  Gastrointestinal: Negative.   Genitourinary: Negative.   Musculoskeletal: Positive for joint swelling.  Skin: Negative for  color change and wound.  Psychiatric/Behavioral: Negative.   All other systems reviewed and are negative.      Objective:    Physical Exam Vitals reviewed.  Constitutional:      Appearance: Normal appearance. He is obese.  HENT:     Head: Normocephalic.     Nose: Nose normal.  Eyes:     Conjunctiva/sclera: Conjunctivae normal.  Cardiovascular:     Rate and Rhythm: Normal rate and regular rhythm.     Pulses: Normal pulses.     Heart sounds: Normal heart sounds.  Pulmonary:     Effort: Pulmonary effort is normal.     Breath sounds: Normal breath sounds.  Abdominal:     General: Bowel sounds are normal.  Musculoskeletal:        General: Swelling and tenderness present.     Right lower leg: Edema present.     Left lower leg: Edema present.  Skin:    General: Skin is warm.     Findings: No erythema.  Neurological:     Mental Status: He is alert and oriented to person, place, and time.  Psychiatric:        Behavior: Behavior normal.     BP 140/86   Pulse 95   Temp 98.3 F (36.8 C)   Ht 6\' 1"  (1.854 m)   Wt (!) 376 lb 3.2 oz (170.6 kg)   SpO2 96%   BMI 49.63 kg/m  Wt Readings from Last 3 Encounters:  11/04/20 (!) 376 lb 3.2 oz (170.6 kg)  09/26/20 (!) 388 lb (176 kg)  06/24/20 (!) 385 lb 3.2 oz (174.7 kg)    Health Maintenance Due  Topic Date Due  . COVID-19 Vaccine (1) Never done  . Fecal DNA (Cologuard)  Never done  . TETANUS/TDAP  08/30/2020    There are no preventive care reminders to display for this patient.   Lab Results  Component Value Date   TSH 2.240 06/24/2020   Lab Results  Component Value Date   WBC 8.1 06/24/2020   HGB 16.2 06/24/2020   HCT 47.8 06/24/2020   MCV 82 06/24/2020   PLT 259 06/24/2020   Lab Results  Component Value Date   NA 137 06/24/2020   K 4.0 06/24/2020   CO2 21 06/24/2020   GLUCOSE 106 (H) 06/24/2020   BUN 14 06/24/2020   CREATININE 0.88 06/24/2020   BILITOT <0.2 06/24/2020   ALKPHOS 70 06/24/2020   AST  18 06/24/2020   ALT 24 06/24/2020   PROT 7.0 06/24/2020   ALBUMIN 4.3 06/24/2020   CALCIUM  8.9 06/24/2020   Lab Results  Component Value Date   CHOL 150 06/24/2020   Lab Results  Component Value Date   HDL 33 (L) 06/24/2020   Lab Results  Component Value Date   LDLCALC 94 06/24/2020   Lab Results  Component Value Date   TRIG 128 06/24/2020   Lab Results  Component Value Date   CHOLHDL 4.5 06/24/2020   No results found for: HGBA1C     Assessment & Plan:   Problem List Items Addressed This Visit      Other   Localized edema - Primary    Bilateral lower extremity +3 Edema not well controlled.  Encourage patient to use compression socks, elevate feet, started patient on Lasix 20 mg tablet daily. Follow-up for worsening unresolved symptoms. Education provided with printed handouts given. Rx sent to pharmacy.      Relevant Medications   furosemide (LASIX) 20 MG tablet       Meds ordered this encounter  Medications  . furosemide (LASIX) 20 MG tablet    Sig: Take 1 tablet (20 mg total) by mouth daily.    Dispense:  30 tablet    Refill:  0    Order Specific Question:   Supervising Provider    Answer:   Raliegh Ip [[id number]]     Daryll Drown, NP

## 2020-11-04 NOTE — Telephone Encounter (Signed)
Thank you Jan

## 2020-11-04 NOTE — Patient Instructions (Addendum)
Edema  Edema is when you have too much fluid in your body or under your skin. Edema may make your legs, feet, and ankles swell up. Swelling is also common in looser tissues, like around your eyes. This is a common condition. It gets more common as you get older. There are many possible causes of edema. Eating too much salt (sodium) and being on your feet or sitting for a long time can cause edema in your legs, feet, and ankles. Hot weather may make edema worse. Edema is usually painless. Your skin may look swollen or shiny. Follow these instructions at home:  Keep the swollen body part raised (elevated) above the level of your heart when you are sitting or lying down.  Do not sit still or stand for a long time.  Do not wear tight clothes. Do not wear garters on your upper legs.  Exercise your legs. This can help the swelling go down.  Wear elastic bandages or support stockings as told by your doctor.  Eat a low-salt (low-sodium) diet to reduce fluid as told by your doctor.  Depending on the cause of your swelling, you may need to limit how much fluid you drink (fluid restriction).  Take over-the-counter and prescription medicines only as told by your doctor. Contact a doctor if:  Treatment is not working.  You have heart, liver, or kidney disease and have symptoms of edema.  You have sudden and unexplained weight gain. Get help right away if:  You have shortness of breath or chest pain.  You cannot breathe when you lie down.  You have pain, redness, or warmth in the swollen areas.  You have heart, liver, or kidney disease and get edema all of a sudden.  You have a fever and your symptoms get worse all of a sudden. Summary  Edema is when you have too much fluid in your body or under your skin.  Edema may make your legs, feet, and ankles swell up. Swelling is also common in looser tissues, like around your eyes.  Raise (elevate) the swollen body part above the level of your  heart when you are sitting or lying down.  Follow your doctor's instructions about diet and how much fluid you can drink (fluid restriction). This information is not intended to replace advice given to you by your health care provider. Make sure you discuss any questions you have with your health care provider. Document Revised: 07/06/2020 Document Reviewed: 07/06/2020 Elsevier Patient Education  2021 [employer]  

## 2020-11-04 NOTE — Telephone Encounter (Signed)
I explained to patient you had not had a chance to send medication in yet and he should check back with the pharmacy later.Marland Kitchen

## 2020-11-04 NOTE — Assessment & Plan Note (Signed)
Bilateral lower extremity +3 Edema not well controlled.  Encourage patient to use compression socks, elevate feet, started patient on Lasix 20 mg tablet daily. Follow-up for worsening unresolved symptoms. Education provided with printed handouts given. Rx sent to pharmacy.

## 2020-12-31 ENCOUNTER — Other Ambulatory Visit: Payer: Self-pay | Admitting: Family Medicine

## 2020-12-31 DIAGNOSIS — I10 Essential (primary) hypertension: Secondary | ICD-10-CM

## 2021-01-02 NOTE — Telephone Encounter (Signed)
Last office visit 06/24/20 for physical, told to follow up in one year  Last refills of both meds 10/03/20, #90, no refills

## 2021-01-23 ENCOUNTER — Other Ambulatory Visit: Payer: Self-pay

## 2021-01-23 ENCOUNTER — Telehealth (INDEPENDENT_AMBULATORY_CARE_PROVIDER_SITE_OTHER): Payer: 59 | Admitting: Family Medicine

## 2021-01-23 ENCOUNTER — Encounter: Payer: Self-pay | Admitting: Family Medicine

## 2021-01-23 DIAGNOSIS — R509 Fever, unspecified: Secondary | ICD-10-CM

## 2021-01-23 LAB — VERITOR FLU A/B WAIVED
Influenza A: NEGATIVE
Influenza B: NEGATIVE

## 2021-01-23 NOTE — Progress Notes (Signed)
Virtual Visit via Video note  I connected with Francisco Adams on 01/23/21 at 10:07 AM by video and verified that I am speaking with the correct person using two identifiers. Francisco Adams is currently located at in his vehicle and nobody is currently with him during visit. The provider, Gwenlyn Fudge, FNP is located in their office at time of visit.  I discussed the limitations, risks, security and privacy concerns of performing an evaluation and management service by video and the availability of in person appointments. I also discussed with the patient that there may be a patient responsible charge related to this service. The patient expressed understanding and agreed to proceed.  Subjective: PCP: Gwenlyn Fudge, FNP  Chief Complaint  Patient presents with  . URI   Patient complains of headache and fever. Additional symptoms include body aches. Onset of symptoms was 2 days ago, unchanged since that time. He is drinking plenty of fluids. Evaluation to date: none. Treatment to date: Tylenol.  Patient has not been fully vaccinated against COVID-19.    ROS: Per HPI  Current Outpatient Medications:  .  metoprolol succinate (TOPROL-XL) 25 MG 24 hr tablet, Take 1 tablet by mouth once daily, Disp: 90 tablet, Rfl: 0 .  olmesartan (BENICAR) 20 MG tablet, Take 1 tablet by mouth once daily, Disp: 90 tablet, Rfl: 0 .  acetaminophen (TYLENOL) 500 MG tablet, Take 1 tablet (500 mg total) by mouth every 6 (six) hours as needed., Disp: 30 tablet, Rfl: 0 .  clotrimazole-betamethasone (LOTRISONE) cream, Apply 1 application topically 2 (two) times daily., Disp: 30 g, Rfl: 3 .  dextromethorphan-guaiFENesin (MUCINEX DM) 30-600 MG 12hr tablet, Take 1 tablet by mouth 2 (two) times daily., Disp: 30 tablet, Rfl: 0 .  fexofenadine (ALLEGRA) 180 MG tablet, Take 1 tablet (180 mg total) by mouth daily., Disp: 90 tablet, Rfl: 3 .  furosemide (LASIX) 20 MG tablet, Take 1 tablet (20 mg total) by mouth daily.,  Disp: 30 tablet, Rfl: 0 .  meloxicam (MOBIC) 15 MG tablet, Take 1 tablet (15 mg total) by mouth daily., Disp: 30 tablet, Rfl: 0 .  ondansetron (ZOFRAN) 4 MG tablet, Take 1 tablet (4 mg total) by mouth every 8 (eight) hours as needed for nausea or vomiting., Disp: 20 tablet, Rfl: 0 .  sildenafil (VIAGRA) 100 MG tablet, Take 1 tablet (100 mg total) by mouth daily as needed for erectile dysfunction., Disp: 30 tablet, Rfl: 2 .  testosterone cypionate (DEPOTESTOSTERONE CYPIONATE) 200 MG/ML injection, Inject 0.5 mLs (100 mg total) into the muscle every 14 (fourteen) days., Disp: 10 mL, Rfl: 1  No Known Allergies Past Medical History:  Diagnosis Date  . Arthritis   . Dermatitis    head  and face   . Palpitations   . Sleep apnea    cpap   . Testosterone deficiency     Observations/Objective: Physical Exam Constitutional:      General: He is not in acute distress.    Appearance: Normal appearance. He is not ill-appearing or toxic-appearing.  Eyes:     General: No scleral icterus.       Right eye: No discharge.        Left eye: No discharge.     Conjunctiva/sclera: Conjunctivae normal.  Pulmonary:     Effort: Pulmonary effort is normal. No respiratory distress.  Neurological:     Mental Status: He is alert and oriented to person, place, and time.  Psychiatric:  Mood and Affect: Mood normal.        Behavior: Behavior normal.        Thought Content: Thought content normal.        Judgment: Judgment normal.    Assessment and Plan: 1. Fever, unspecified fever cause Flu and COVID to assess for cause of fever. Continue Tylenol to reduce.  - Veritor Flu A/B Waived; Future - Novel Coronavirus, NAA ([facility]); Future   Follow Up Instructions:   I discussed the assessment and treatment plan with the patient. The patient was provided an opportunity to ask questions and all were answered. The patient agreed with the plan and demonstrated an understanding of the instructions.   The  patient was advised to call back or seek an in-person evaluation if the symptoms worsen or if the condition fails to improve as anticipated.  The above assessment and management plan was discussed with the patient. The patient verbalized understanding of and has agreed to the management plan. Patient is aware to call the clinic if symptoms persist or worsen. Patient is aware when to return to the clinic for a follow-up visit. Patient educated on when it is appropriate to go to the emergency department.   Time call ended: 10:18 AM  I provided 11 minutes of face-to-face time during this encounter.   Deliah Boston, MSN, APRN, FNP-C [facility] 01/23/21

## 2021-01-24 LAB — NOVEL CORONAVIRUS, NAA: SARS-CoV-2, NAA: NOT DETECTED

## 2021-01-25 ENCOUNTER — Encounter (HOSPITAL_COMMUNITY): Payer: Self-pay

## 2021-01-25 ENCOUNTER — Emergency Department (HOSPITAL_COMMUNITY): Payer: No Typology Code available for payment source

## 2021-01-25 ENCOUNTER — Inpatient Hospital Stay (HOSPITAL_COMMUNITY)
Admission: EM | Admit: 2021-01-25 | Discharge: 2021-01-26 | DRG: 603 | Disposition: A | Discharge status: Home / Self Care | Payer: No Typology Code available for payment source | Attending: Family Medicine | Admitting: Family Medicine

## 2021-01-25 ENCOUNTER — Emergency Department: Payer: Worker's Compensation

## 2021-01-25 ENCOUNTER — Other Ambulatory Visit: Payer: Self-pay

## 2021-01-25 DIAGNOSIS — I10 Essential (primary) hypertension: Secondary | ICD-10-CM | POA: Diagnosis present

## 2021-01-25 DIAGNOSIS — Z79899 Other long term (current) drug therapy: Secondary | ICD-10-CM | POA: Diagnosis not present

## 2021-01-25 DIAGNOSIS — L03115 Cellulitis of right lower limb: Secondary | ICD-10-CM | POA: Diagnosis present

## 2021-01-25 DIAGNOSIS — S99921S Unspecified injury of right foot, sequela: Secondary | ICD-10-CM

## 2021-01-25 DIAGNOSIS — Z20822 Contact with and (suspected) exposure to covid-19: Secondary | ICD-10-CM | POA: Diagnosis present

## 2021-01-25 DIAGNOSIS — Z6841 Body Mass Index (BMI) 40.0 and over, adult: Secondary | ICD-10-CM

## 2021-01-25 DIAGNOSIS — F17220 Nicotine dependence, chewing tobacco, uncomplicated: Secondary | ICD-10-CM | POA: Diagnosis present

## 2021-01-25 LAB — CBC WITH DIFFERENTIAL/PLATELET
Abs Immature Granulocytes: 0.08 10*3/uL — ABNORMAL HIGH (ref 0.00–0.07)
Basophils Absolute: 0.1 10*3/uL (ref 0.0–0.1)
Basophils Relative: 1 %
Eosinophils Absolute: 0.2 10*3/uL (ref 0.0–0.5)
Eosinophils Relative: 2 %
HCT: 49.2 % (ref 39.0–52.0)
Hemoglobin: 16.3 g/dL (ref 13.0–17.0)
Immature Granulocytes: 1 %
Lymphocytes Relative: 17 %
Lymphs Abs: 1.6 10*3/uL (ref 0.7–4.0)
MCH: 28.8 pg (ref 26.0–34.0)
MCHC: 33.1 g/dL (ref 30.0–36.0)
MCV: 87.1 fL (ref 80.0–100.0)
Monocytes Absolute: 1.2 10*3/uL — ABNORMAL HIGH (ref 0.1–1.0)
Monocytes Relative: 12 %
Neutro Abs: 6.4 10*3/uL (ref 1.7–7.7)
Neutrophils Relative %: 67 %
Platelets: 226 10*3/uL (ref 150–400)
RBC: 5.65 MIL/uL (ref 4.22–5.81)
RDW: 13.6 % (ref 11.5–15.5)
WBC: 9.5 10*3/uL (ref 4.0–10.5)
nRBC: 0 % (ref 0.0–0.2)

## 2021-01-25 LAB — BASIC METABOLIC PANEL
Anion gap: 7 (ref 5–15)
BUN: 17 mg/dL (ref 6–20)
CO2: 26 mmol/L (ref 22–32)
Calcium: 8.9 mg/dL (ref 8.9–10.3)
Chloride: 103 mmol/L (ref 98–111)
Creatinine, Ser: 0.84 mg/dL (ref 0.61–1.24)
GFR, Estimated: >60 mL/min (ref >60)
Glucose, Bld: 101 mg/dL — ABNORMAL HIGH (ref 70–99)
Potassium: 4.1 mmol/L (ref 3.5–5.1)
Sodium: 136 mmol/L (ref 135–145)

## 2021-01-25 LAB — LACTIC ACID, PLASMA: Lactic Acid, Venous: 1.4 mmol/L (ref 0.5–1.9)

## 2021-01-25 MED ORDER — ONDANSETRON HCL 4 MG/2ML IJ SOLN: 4.0000 mg | Freq: Four times a day (QID) | INTRAMUSCULAR | Status: DC | PRN

## 2021-01-25 MED ORDER — VANCOMYCIN HCL 1500 MG/300ML IV SOLN
1500.0000 mg | Freq: Two times a day (BID) | INTRAVENOUS | Status: DC
  Administered 2021-01-26: 1500 mg via INTRAVENOUS
  Filled 2021-01-25: qty 300

## 2021-01-25 MED ORDER — IOHEXOL 300 MG/ML  SOLN
75.0000 mL | Freq: Once | INTRAMUSCULAR | Status: AC | PRN
  Administered 2021-01-25: 75 mL via INTRAVENOUS

## 2021-01-25 MED ORDER — ACETAMINOPHEN 325 MG PO TABS
650.0000 mg | ORAL_TABLET | Freq: Four times a day (QID) | ORAL | Status: DC | PRN
  Administered 2021-01-25 – 2021-01-26 (×2): 650 mg via ORAL
  Filled 2021-01-25 (×2): qty 2

## 2021-01-25 MED ORDER — VANCOMYCIN HCL 1500 MG/300ML IV SOLN
1500.0000 mg | Freq: Once | INTRAVENOUS | Status: AC
  Administered 2021-01-25: 1500 mg via INTRAVENOUS
  Filled 2021-01-25: qty 300

## 2021-01-25 MED ORDER — ENOXAPARIN SODIUM 80 MG/0.8ML IJ SOSY
80.0000 mg | PREFILLED_SYRINGE | INTRAMUSCULAR | Status: DC
  Administered 2021-01-25: 80 mg via SUBCUTANEOUS
  Filled 2021-01-25: qty 0.8

## 2021-01-25 MED ORDER — ACETAMINOPHEN 650 MG RE SUPP: 650.0000 mg | Freq: Four times a day (QID) | RECTAL | Status: DC | PRN

## 2021-01-25 MED ORDER — IRBESARTAN 150 MG PO TABS
150.0000 mg | ORAL_TABLET | Freq: Every day | ORAL | Status: DC
  Administered 2021-01-26: 150 mg via ORAL
  Filled 2021-01-25: qty 1

## 2021-01-25 MED ORDER — METOPROLOL SUCCINATE ER 25 MG PO TB24
25.0000 mg | ORAL_TABLET | Freq: Every day | ORAL | Status: DC
  Administered 2021-01-26: 25 mg via ORAL
  Filled 2021-01-25: qty 1

## 2021-01-25 MED ORDER — ONDANSETRON HCL 4 MG PO TABS: 4.0000 mg | ORAL_TABLET | Freq: Four times a day (QID) | ORAL | Status: DC | PRN

## 2021-01-25 MED ORDER — VANCOMYCIN HCL IN DEXTROSE 1-5 GM/200ML-% IV SOLN
1000.0000 mg | Freq: Once | INTRAVENOUS | Status: AC
  Administered 2021-01-25: 1000 mg via INTRAVENOUS
  Filled 2021-01-25: qty 200

## 2021-01-25 MED ORDER — POLYETHYLENE GLYCOL 3350 17 G PO PACK: 17.0000 g | PACK | Freq: Every day | ORAL | Status: DC | PRN

## 2021-01-25 MED ORDER — MORPHINE SULFATE (PF) 4 MG/ML IV SOLN
4.0000 mg | INTRAVENOUS | Status: DC | PRN
  Administered 2021-01-25 – 2021-01-26 (×2): 4 mg via INTRAVENOUS
  Filled 2021-01-25 (×2): qty 1

## 2021-01-25 NOTE — Progress Notes (Signed)
Pharmacy Antibiotic Note  Francisco Adams is a 50 y.o. male admitted on 01/25/2021 with cellulitis.  Pharmacy has been consulted for vancomycin dosing.  Plan: Vancomycin 1500mg  IV every 12 hours.  Goal trough 10-15 mcg/mL.  Height: 6' (182.9 cm) Weight: (!) 170.1 kg (375 lb) IBW/kg (Calculated) : 77.6  Temp (24hrs), Avg:99.2 F (37.3 C), Min:99.1 F (37.3 C), Max:99.3 F (37.4 C)  Recent Labs  Lab 01/25/21 1435  WBC 9.5  CREATININE 0.84  LATICACIDVEN 1.4    Estimated Creatinine Clearance: 166.7 mL/min (by C-G formula based on SCr of 0.84 mg/dL).    No Known Allergies  Antimicrobials this admission: 5/4 vancomycin >>  Microbiology results:  Thank you for allowing pharmacy to be a part of this patient's care.  7/4 Francisco Adams 01/25/2021 8:00 PM

## 2021-01-25 NOTE — H&P (Addendum)
History and Physical    VIYAN ROSAMOND XVQ:[id number] DOB: 12-15-1970 DOA: 01/25/2021  PCP: Gwenlyn Fudge, FNP   Patient coming from: Home  I have personally briefly reviewed patient's old medical records in [facility]  Chief Complaint: Right Leg pain and swelling  HPI: Francisco Adams is a 50 y.o. male with medical history significant for hypertension, obstructive sleep apnea. Patient presented to the ED with complaints of leg pain redness and swelling, started 4 days ago.  On 7 April almost a month ago, and 800 pounds table fell on patient's right foot.  He initially had pain, and at some point there were blisters which healed.  He had an x-ray and was told there might be some broken bones. Reports temperatures of up to 102 at home.  No vomiting.  ED Course: Temperature 99.3, heart rate 91-94.  Respiratory 18.  History of systolic 130s to 093O.  O2 sats greater than 96% on room air.  Lactic acid 1.4.  WBC 9.5.  Right lower extremity venous Dopplers negative.  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  No osteomyelitis myositis or septic joint.  Subcutaneous edema of the lower leg compatible with dependent change of cellulitis. IV vancomycin started. EDP talked with orthopedist-Dr. Dallas Schimke, did not feel surgical intervention was emergently needed, patient can follow-up in clinic.  Review of Systems: As per HPI all other systems reviewed and negative.  Past Medical History:  Diagnosis Date  . Arthritis   . Dermatitis    head  and face   . Palpitations   . Sleep apnea    cpap   . Testosterone deficiency     Past Surgical History:  Procedure Laterality Date  . ANKLE RECONSTRUCTION Right   . SPINE SURGERY     cervical - 3 disc fusions     reports that he has quit smoking. His smokeless tobacco use includes chew. He reports that he does not drink alcohol and  does not use drugs.  No Known Allergies  Family History  Problem Relation Age of Onset  . Alcohol abuse Mother   . Hypertension Mother   . Heart attack Mother   . Alcohol abuse Father   . Hypertension Father   . Heart attack Father   . Seizures Brother   . AAA (abdominal aortic aneurysm) Brother   . Drug abuse Brother     Prior to Admission medications   Medication Sig Start Date End Date Taking? Authorizing Provider  metoprolol succinate (TOPROL-XL) 25 MG 24 hr tablet Take 1 tablet by mouth once daily 01/02/21   Jannifer Rodney A, FNP  olmesartan (BENICAR) 20 MG tablet Take 1 tablet by mouth once daily 01/02/21   Jannifer Rodney A, FNP  acetaminophen (TYLENOL) 500 MG tablet Take 1 tablet (500 mg total) by mouth every 6 (six) hours as needed. 10/14/20   Daryll Drown, NP  clotrimazole-betamethasone (LOTRISONE) cream Apply 1 application topically 2 (two) times daily. 06/13/20   Gwenlyn Fudge, FNP  dextromethorphan-guaiFENesin [facility] DM) 30-600 MG 12hr tablet Take 1 tablet by mouth 2 (two) times daily. 10/14/20   Daryll Drown, NP  fexofenadine (ALLEGRA) 180 MG tablet Take 1 tablet (180 mg total) by mouth daily. 06/24/20   Gwenlyn Fudge, FNP  furosemide (LASIX) 20 MG tablet Take 1 tablet (20 mg total) by mouth daily. 11/04/20   Lynnell Chad  M, NP  meloxicam (MOBIC) 15 MG tablet Take 1 tablet (15 mg total) by mouth daily. 09/26/20   Gabriel Earing, FNP  ondansetron (ZOFRAN) 4 MG tablet Take 1 tablet (4 mg total) by mouth every 8 (eight) hours as needed for nausea or vomiting. 10/21/20   Daryll Drown, NP  sildenafil (VIAGRA) 100 MG tablet Take 1 tablet (100 mg total) by mouth daily as needed for erectile dysfunction. 03/18/20   Gwenlyn Fudge, FNP  testosterone cypionate (DEPOTESTOSTERONE CYPIONATE) 200 MG/ML injection Inject 0.5 mLs (100 mg total) into the muscle every 14 (fourteen) days. 06/24/20   Gwenlyn Fudge, FNP    Physical Exam: Vitals:   01/25/21 1140 01/25/21  1436 01/25/21 1542 01/25/21 1805  BP:  (!) 145/90  131/78  Pulse:  91  94  Resp:  19  18  Temp:   99.3 F (37.4 C)   TempSrc:   Oral   SpO2:  99%  96%  Weight: (!) 170.1 kg     Height: 6' (1.829 m)       Constitutional: NAD, calm, comfortable Vitals:   01/25/21 1140 01/25/21 1436 01/25/21 1542 01/25/21 1805  BP:  (!) 145/90  131/78  Pulse:  91  94  Resp:  19  18  Temp:   99.3 F (37.4 C)   TempSrc:   Oral   SpO2:  99%  96%  Weight: (!) 170.1 kg     Height: 6' (1.829 m)      Eyes: PERRL, lids and conjunctivae normal ENMT: Mucous membranes are moist. Neck: normal, supple, no masses, no thyromegaly Respiratory: clear to auscultation bilaterally, no wheezing, no crackles. Normal respiratory effort. No accessory muscle use.  Cardiovascular: Regular rate and rhythm, no murmurs / rubs / gallops.  1+ pitting extremity edema worse on the right, chronic. BiLateral feet warm and well-perfused. Abdomen: obese, no tenderness, no masses palpated. No hepatosplenomegaly. Bowel sounds positive.  Musculoskeletal: no clubbing / cyanosis. No joint deformity upper and lower extremities. Good ROM, no contractures.  Skin: Swelling erythema and tenderness involving right feet extending up towards the knee.  No open wounds or ulcers.  No rashes, lesions, ulcers. No induration Neurologic: No apparent cranial formality, moving extremities spontaneously. Psychiatric: Normal judgment and insight. Alert and oriented x 3. Normal mood. \         Labs on Admission: I have personally reviewed following labs and imaging studies  CBC: Recent Labs  Lab 01/25/21 1435  WBC 9.5  NEUTROABS 6.4  HGB 16.3  HCT 49.2  MCV 87.1  PLT 226   Basic Metabolic Panel: Recent Labs  Lab 01/25/21 1435  NA 136  K 4.1  CL 103  CO2 26  GLUCOSE 101*  BUN 17  CREATININE 0.84  CALCIUM 8.9   Radiological Exams on Admission: CT FOOT RIGHT W CONTRAST  Result Date: 01/25/2021 CLINICAL DATA:  Right lower leg  pain, swelling and redness for 1 month. EXAM: CT OF THE LOWER RIGHT EXTREMITY WITH CONTRAST TECHNIQUE: Multidetector CT imaging of the lower right extremity was performed according to the standard protocol following intravenous contrast administration. CONTRAST:  75 mL OMNIPAQUE IOHEXOL 300 MG/ML  SOLN COMPARISON:  None. FINDINGS: Bones/Joint/Cartilage No acute bony abnormality is seen. No bony destructive change or periosteal reaction to suggest osteomyelitis. 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 An osteochondral lesion of the talar dome measures 1.3 cm transverse by 1.9 cm craniocaudal. 2-3 loose bone fragments are seen with the lesion and  it has a central depression of up to 0.7 cm. There is degenerative change about the tibiotalar joint with osteophytosis seen off the anterior lip of the tibia. Ligaments Suboptimally assessed by CT. Muscles and Tendons Intact.  No intramuscular fluid collection or gas. Soft tissues There is stranding in subcutaneous tissues of the lower leg and foot. No radiopaque foreign body or soft tissue gas. IMPRESSION: Subcutaneous edema about the lower leg and foot compatible with dependent change and/or cellulitis. Negative for CT evidence of osteomyelitis, myositis or septic joint. 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 Tibiotalar osteoarthritis noted. Electronically Signed   By: Drusilla Kanner M.D.   On: 01/25/2021 17:29   CT TIBIA FIBULA RIGHT W CONTRAST  Result Date: 01/25/2021 CLINICAL DATA:  Right lower leg pain, swelling and redness for 1 month. EXAM: CT OF THE LOWER RIGHT EXTREMITY WITH CONTRAST TECHNIQUE: Multidetector CT imaging of the lower right extremity was performed according to the standard protocol following intravenous contrast administration. CONTRAST:  75 mL OMNIPAQUE IOHEXOL 300 MG/ML  SOLN COMPARISON:  None. FINDINGS: Bones/Joint/Cartilage No acute bony  abnormality is seen. No bony destructive change or periosteal reaction to suggest osteomyelitis. 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 An osteochondral lesion of the talar dome measures 1.3 cm transverse by 1.9 cm craniocaudal. 2-3 loose bone fragments are seen with the lesion and it has a central depression of up to 0.7 cm. There is degenerative change about the tibiotalar joint with osteophytosis seen off the anterior lip of the tibia. Ligaments Suboptimally assessed by CT. Muscles and Tendons Intact.  No intramuscular fluid collection or gas. Soft tissues There is stranding in subcutaneous tissues of the lower leg and foot. No radiopaque foreign body or soft tissue gas. IMPRESSION: Subcutaneous edema about the lower leg and foot compatible with dependent change and/or cellulitis. Negative for CT evidence of osteomyelitis, myositis or septic joint. 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 Tibiotalar osteoarthritis noted. Electronically Signed   By: Drusilla Kanner M.D.   On: 01/25/2021 17:29   US Venous Img Lower Unilateral Right  Result Date: 01/25/2021 CLINICAL DATA:  50 year old male with right lower extremity pain, redness, and swelling for 1 month. EXAM: RIGHT LOWER EXTREMITY VENOUS DOPPLER ULTRASOUND TECHNIQUE: Gray-scale sonography with graded compression, as well as color Doppler and duplex ultrasound were performed to evaluate the right lower extremity deep venous systems from the level of the common femoral vein and including the common femoral, femoral, profunda femoral, popliteal and calf veins including the posterior tibial, peroneal and gastrocnemius veins when visible. Spectral Doppler was utilized to evaluate flow at rest and with distal augmentation maneuvers in the common femoral, femoral and popliteal veins. The contralateral common femoral vein was also evaluated for comparison.  COMPARISON:  None. FINDINGS: RIGHT LOWER EXTREMITY Common Femoral Vein: No evidence of thrombus. Normal compressibility, respiratory phasicity and response to augmentation. Central Greater Saphenous Vein: No evidence of thrombus. Normal compressibility and flow on color Doppler imaging. Central Profunda Femoral Vein: No evidence of thrombus. Normal compressibility and flow on color Doppler imaging. Femoral Vein: No evidence of thrombus. Normal compressibility, respiratory phasicity and response to augmentation. Popliteal Vein: No evidence of thrombus. Normal compressibility, respiratory phasicity and response to augmentation. Calf Veins: No evidence of thrombus. Normal compressibility and flow on color Doppler imaging. Other Findings: Several enlarged  right inguinal lymph nodes, likely reactive. Diffuse calf edema is noted. LEFT LOWER EXTREMITY Common Femoral Vein: No evidence of thrombus. Normal compressibility, respiratory phasicity and response to augmentation. IMPRESSION: 1. No evidence of right lower extremity deep venous thrombosis. 2. Diffuse subcutaneous calf edema. 3. Enlarged right inguinal lymph nodes, likely reactive. Marliss Cootsylan Suttle, MD [facility] Electronically Signed   By: Marliss Cootsylan  Suttle MD   On: 01/25/2021 14:40    EKG: None  Assessment/Plan Active Problems:   Essential hypertension with goal blood pressure less than 130/80   Morbid obesity (HCC)   Cellulitis of right leg   Right lower extremity cellulitis-T-max 99.3, WBC 9.5.  Heart rate 90s.  Rules out for sepsis.  Normal lactic acid 1.4.  Has chronic lower extremity swelling for which he sometimes takes Lasix 20 mg daily.   - CT foot shows-avascular necrosis of the talus with large subchondral lesion of the medial talar dome containing 2-3 small loose bone fragments.  No osteomyelitis, myositis or septic joint. - EDP talked to Dr. Dallas Schimkeairns, no emergent surgery needed, patient can  follow-up in clinic. -IV vancomycin started in the ED, continue -Right lower extremity duplex ultrasound negative for DVT -IV morphine 4 mg Q4 hourly as needed - BMP, CBC a.m  Hypertension-stable. -Resume metoprolol, olmesartan.  DVT prophylaxis: Lovenox Code Status:  Full code Family Communication: Significant other/Fiance at bedside Disposition Plan: ~ 2 days Consults called: None Admission status: Inpt, Med surg I certify that at the point of admission it is my clinical judgment that the patient will require inpatient hospital care spanning beyond 2 midnights from the point of admission due to high intensity of service, high risk for further deterioration and high frequency of surveillance required.   Onnie BoerEjiroghene E Chesnee Floren MD [facility]  01/25/2021, 7:14 PM

## 2021-01-25 NOTE — Progress Notes (Signed)
Pt has his home unit, RT will check on patient once bed situation is resolved

## 2021-01-25 NOTE — ED Provider Notes (Signed)
[facility] EMERGENCY DEPARTMENT Provider Note   CSN: [id number] Arrival date & time: 01/25/21  1118     History Chief Complaint  Patient presents with  . Foot Pain    Francisco Adams is a 50 y.o. male.  HPI      Francisco Adams is a 50 y.o. male who presents to the Emergency Department complaining of increasing swelling pain and redness of the right lower extremity.  He endorses a crush type injury to his right foot 1 month ago when a 800 pound wooden table fell onto the dorsal surface of his foot.  He was seen at emergency department in [place] and had x-rays of the foot without evidence of bony injury.  He endorses having continued swelling of his foot with blistering.  4 days ago, he noticed pain and swelling progressing to his right lower leg along with patchy redness of his lower leg.  Fever at home of near 102.  He had a previous prescription for clindamycin.  He took 3 days of the medication without relief.  He also endorses having some tenderness to his medial right thigh and pain with palpation and weightbearing.  No history of DVT or blood thinner use.  No nausea or vomiting.  No pain to his groin or abdomen.    Past Medical History:  Diagnosis Date  . Arthritis   . Dermatitis    head  and face   . Palpitations   . Sleep apnea    cpap   . Testosterone deficiency     Patient Active Problem List   Diagnosis Date Noted  . Chest congestion 10/14/2020  . Seasonal allergies 06/24/2020  . Rosacea 06/23/2019  . Hypertriglyceridemia 06/23/2019  . Palpitations 05/06/2019  . Morbid obesity (HCC) 05/06/2019  . Essential hypertension with goal blood pressure less than 130/80 05/26/2015  . Localized edema 05/26/2015  . Hypogonadism in male 08/30/2014    Past Surgical History:  Procedure Laterality Date  . ANKLE RECONSTRUCTION Right   . SPINE SURGERY     cervical - 3 disc fusions       Family History  Problem Relation Age of Onset  . Alcohol abuse Mother   .  Hypertension Mother   . Heart attack Mother   . Alcohol abuse Father   . Hypertension Father   . Heart attack Father   . Seizures Brother   . AAA (abdominal aortic aneurysm) Brother   . Drug abuse Brother     Social History   Tobacco Use  . Smoking status: Former Games developer  . Smokeless tobacco: Current User    Types: Chew  Vaping Use  . Vaping Use: Never used  Substance Use Topics  . Alcohol use: Never  . Drug use: Never    Home Medications Prior to Admission medications   Medication Sig Start Date End Date Taking? Authorizing Provider  metoprolol succinate (TOPROL-XL) 25 MG 24 hr tablet Take 1 tablet by mouth once daily 01/02/21   Jannifer Rodney A, FNP  olmesartan (BENICAR) 20 MG tablet Take 1 tablet by mouth once daily 01/02/21   Jannifer Rodney A, FNP  acetaminophen (TYLENOL) 500 MG tablet Take 1 tablet (500 mg total) by mouth every 6 (six) hours as needed. 10/14/20   Daryll Drown, NP  clotrimazole-betamethasone (LOTRISONE) cream Apply 1 application topically 2 (two) times daily. 06/13/20   Gwenlyn Fudge, FNP  dextromethorphan-guaiFENesin [facility] DM) 30-600 MG 12hr tablet Take 1 tablet by mouth 2 (two) times daily.  10/14/20   Daryll Drown, NP  fexofenadine (ALLEGRA) 180 MG tablet Take 1 tablet (180 mg total) by mouth daily. 06/24/20   Gwenlyn Fudge, FNP  furosemide (LASIX) 20 MG tablet Take 1 tablet (20 mg total) by mouth daily. 11/04/20   Daryll Drown, NP  meloxicam (MOBIC) 15 MG tablet Take 1 tablet (15 mg total) by mouth daily. 09/26/20   Gabriel Earing, FNP  ondansetron (ZOFRAN) 4 MG tablet Take 1 tablet (4 mg total) by mouth every 8 (eight) hours as needed for nausea or vomiting. 10/21/20   Daryll Drown, NP  sildenafil (VIAGRA) 100 MG tablet Take 1 tablet (100 mg total) by mouth daily as needed for erectile dysfunction. 03/18/20   Gwenlyn Fudge, FNP  testosterone cypionate (DEPOTESTOSTERONE CYPIONATE) 200 MG/ML injection Inject 0.5 mLs (100 mg total) into the  muscle every 14 (fourteen) days. 06/24/20   Gwenlyn Fudge, FNP    Allergies    Patient has no known allergies.  Review of Systems   Review of Systems  Constitutional: Positive for fever. Negative for appetite change, chills and fatigue.  HENT: Negative for sore throat and trouble swallowing.   Respiratory: Negative for shortness of breath.   Cardiovascular: Negative for chest pain and palpitations.  Gastrointestinal: Negative for abdominal pain, nausea and vomiting.  Genitourinary: Negative for decreased urine volume, dysuria, flank pain, scrotal swelling and testicular pain.  Musculoskeletal: Positive for myalgias (Pain redness and swelling of the right foot and lower leg). Negative for arthralgias, back pain, neck pain and neck stiffness.  Skin: Positive for color change. Negative for rash.  Neurological: Negative for dizziness, weakness and numbness.  Hematological: Does not bruise/bleed easily.    Physical Exam Updated Vital Signs BP (!) 145/90   Pulse 91   Temp 99.1 F (37.3 C) (Oral)   Resp 19   Ht 6' (1.829 m)   Wt (!) 170.1 kg   SpO2 99%   BMI 50.86 kg/m   Physical Exam Vitals and nursing note reviewed.  Constitutional:      Appearance: Normal appearance. He is obese. He is not ill-appearing or toxic-appearing.  HENT:     Head: Normocephalic.  Neck:     Thyroid: No thyromegaly.     Meningeal: Kernig's sign absent.  Cardiovascular:     Rate and Rhythm: Normal rate and regular rhythm.     Pulses: Normal pulses.     Comments: Dorsalis pedis and posterior tibial pulses heard with portable Doppler. Pulmonary:     Effort: Pulmonary effort is normal.     Breath sounds: Normal breath sounds. No wheezing.  Abdominal:     Palpations: Abdomen is soft.     Tenderness: There is no abdominal tenderness. There is no guarding or rebound.  Musculoskeletal:        General: Tenderness and signs of injury present. No swelling. Normal range of motion.     Cervical back:  Normal range of motion and neck supple.     Comments: Erythema and significant edema of the right lower extremity.  Confluent erythema of the dorsal right foot.  No open wound.  Patchy erythema of the right lower leg extending to the distal thigh.  Tenderness palpation of the medial aspect of the thigh with lymphangitis noted.  No palpable crepitus of the soft tissue.  See attached photo  Skin:    General: Skin is warm.     Capillary Refill: Capillary refill takes less than 2 seconds.  Findings: Erythema and rash present.  Neurological:     General: No focal deficit present.     Mental Status: He is alert.     Motor: No weakness.        Patient gave verbal consent for imaging to be stored in medical record.  ED Results / Procedures / Treatments   Labs (all labs ordered are listed, but only abnormal results are displayed) Labs Reviewed  CBC WITH DIFFERENTIAL/PLATELET - Abnormal; Notable for the following components:      Result Value   Monocytes Absolute 1.2 (*)    Abs Immature Granulocytes 0.08 (*)    All other components within normal limits  BASIC METABOLIC PANEL - Abnormal; Notable for the following components:   Glucose, Bld 101 (*)    All other components within normal limits  SARS CORONAVIRUS 2 (TAT 6-24 HRS)  LACTIC ACID, PLASMA    EKG None  Radiology CT FOOT RIGHT W CONTRAST  Result Date: 01/25/2021 CLINICAL DATA:  Right lower leg pain, swelling and redness for 1 month. EXAM: CT OF THE LOWER RIGHT EXTREMITY WITH CONTRAST TECHNIQUE: Multidetector CT imaging of the lower right extremity was performed according to the standard protocol following intravenous contrast administration. CONTRAST:  75 mL OMNIPAQUE IOHEXOL 300 MG/ML  SOLN COMPARISON:  None. FINDINGS: Bones/Joint/Cartilage No acute bony abnormality is seen. No bony destructive change or periosteal reaction to suggest osteomyelitis. 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 An osteochondral lesion of the  talar dome measures 1.3 cm transverse by 1.9 cm craniocaudal. 2-3 loose bone fragments are seen with the lesion and it has a central depression of up to 0.7 cm. There is degenerative change about the tibiotalar joint with osteophytosis seen off the anterior lip of the tibia. Ligaments Suboptimally assessed by CT. Muscles and Tendons Intact.  No intramuscular fluid collection or gas. Soft tissues There is stranding in subcutaneous tissues of the lower leg and foot. No radiopaque foreign body or soft tissue gas. IMPRESSION: Subcutaneous edema about the lower leg and foot compatible with dependent change and/or cellulitis. Negative for CT evidence of osteomyelitis, myositis or septic joint. 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 Tibiotalar osteoarthritis noted. Electronically Signed   By: Drusilla Kanner M.D.   On: 01/25/2021 17:29   CT TIBIA FIBULA RIGHT W CONTRAST  Result Date: 01/25/2021 CLINICAL DATA:  Right lower leg pain, swelling and redness for 1 month. EXAM: CT OF THE LOWER RIGHT EXTREMITY WITH CONTRAST TECHNIQUE: Multidetector CT imaging of the lower right extremity was performed according to the standard protocol following intravenous contrast administration. CONTRAST:  75 mL OMNIPAQUE IOHEXOL 300 MG/ML  SOLN COMPARISON:  None. FINDINGS: Bones/Joint/Cartilage No acute bony abnormality is seen. No bony destructive change or periosteal reaction to suggest osteomyelitis. 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 An osteochondral lesion of the talar dome measures 1.3 cm transverse by 1.9 cm craniocaudal. 2-3 loose bone fragments are seen with the lesion and it has a central depression of up to 0.7 cm. There is degenerative change about the tibiotalar joint with osteophytosis seen off the anterior lip of the tibia. Ligaments Suboptimally assessed by CT. Muscles and Tendons Intact.  No intramuscular fluid  collection or gas. Soft tissues There is stranding in subcutaneous tissues of the lower leg and foot. No radiopaque foreign body or soft tissue gas. IMPRESSION: Subcutaneous edema about the lower leg and foot compatible with  dependent change and/or cellulitis. Negative for CT evidence of osteomyelitis, myositis or septic joint. 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 Tibiotalar osteoarthritis noted. Electronically Signed   By: Drusilla Kannerhomas  Dalessio M.D.   On: 01/25/2021 17:29   US Venous Img Lower Unilateral Right  Result Date: 01/25/2021 CLINICAL DATA:  50 year old male with right lower extremity pain, redness, and swelling for 1 month. EXAM: RIGHT LOWER EXTREMITY VENOUS DOPPLER ULTRASOUND TECHNIQUE: Gray-scale sonography with graded compression, as well as color Doppler and duplex ultrasound were performed to evaluate the right lower extremity deep venous systems from the level of the common femoral vein and including the common femoral, femoral, profunda femoral, popliteal and calf veins including the posterior tibial, peroneal and gastrocnemius veins when visible. Spectral Doppler was utilized to evaluate flow at rest and with distal augmentation maneuvers in the common femoral, femoral and popliteal veins. The contralateral common femoral vein was also evaluated for comparison. COMPARISON:  None. FINDINGS: RIGHT LOWER EXTREMITY Common Femoral Vein: No evidence of thrombus. Normal compressibility, respiratory phasicity and response to augmentation. Central Greater Saphenous Vein: No evidence of thrombus. Normal compressibility and flow on color Doppler imaging. Central Profunda Femoral Vein: No evidence of thrombus. Normal compressibility and flow on color Doppler imaging. Femoral Vein: No evidence of thrombus. Normal compressibility, respiratory phasicity and response to augmentation. Popliteal Vein: No evidence of  thrombus. Normal compressibility, respiratory phasicity and response to augmentation. Calf Veins: No evidence of thrombus. Normal compressibility and flow on color Doppler imaging. Other Findings: Several enlarged right inguinal lymph nodes, likely reactive. Diffuse calf edema is noted. LEFT LOWER EXTREMITY Common Femoral Vein: No evidence of thrombus. Normal compressibility, respiratory phasicity and response to augmentation. IMPRESSION: 1. No evidence of right lower extremity deep venous thrombosis. 2. Diffuse subcutaneous calf edema. 3. Enlarged right inguinal lymph nodes, likely reactive. Marliss Cootsylan Suttle, MD [facility] Electronically Signed   By: Marliss Cootsylan  Suttle MD   On: 01/25/2021 14:40    Procedures Procedures   Medications Ordered in ED Medications  vancomycin (VANCOCIN) IVPB 1000 mg/200 mL premix (has no administration in time range)    ED Course  I have reviewed the triage vital signs and the nursing notes.  Pertinent labs & imaging results that were available during my care of the patient were reviewed by me and considered in my medical decision making (see chart for details).    MDM Rules/Calculators/A&P                         Patient here with crush type injury to the dorsal foot 1 month ago when a 800 pound table fell on his foot.  Had x-rays at the time of injury without bony finding.  Continued to have progressive swelling and discoloration of his foot that progressed into his lower leg and thigh x4 to 5 days ago.  Fever and chills at home.  On exam, very tense edema of the right lower leg.  No palpable crepitus. Will obtain labs and venous ultrasound.  Clinically, I suspect this is cellulitis but given reported fever and progressive edema and erythema I feel the patient would benefit from CT of the extremity to rule out necrotizing fasciitis.  Pt also seen by Dr. Estell HarpinZammit and care plan discussed.     Labs interpreted by me,  no leukocytosis.  No electrolyte derangement.  Lactic acid within  normal limits.  Venous imaging of the extremity without evidence for DVT.  Patient given IV vancomycin here.  He will likely need admission for cellulitis.  Spoke with [facility] hospitalist, Dr. Wendall Stade who is agreeable to admit patient.  She request that orthopedics be consulted  Discussed CT findings with orthopedics, Dr. Aretha Parrot.  Did not feel that surgical intervention needed emergently,  he will be glad to follow pt in clinic   Discussed with hospitalist after ortho consultation.  Agrees to admit.    Final Clinical Impression(s) / ED Diagnoses Final diagnoses:  Cellulitis of right lower extremity    Rx / DC Orders ED Discharge Orders    None       Rosey Bath 01/25/21 Rhea Bleacher, MD 01/26/21 [phone]

## 2021-01-25 NOTE — ED Triage Notes (Signed)
Pt to er, pt states that about a month ago he dropped a table on his foot, states that after that he had some pain and his foot had some blisters, then the blisters popped and it was feeling better, states that he foot then started to get really red and he felt like he had a fever, states that he had some old clindamycin so he took that, but it doesn't seem to be getting any better.

## 2021-01-25 NOTE — ED Notes (Signed)
Pedal pulse and ankle pulse marked and noted.

## 2021-01-26 DIAGNOSIS — S99921S Unspecified injury of right foot, sequela: Secondary | ICD-10-CM

## 2021-01-26 DIAGNOSIS — L03115 Cellulitis of right lower limb: Principal | ICD-10-CM

## 2021-01-26 LAB — BASIC METABOLIC PANEL
Anion gap: 7 (ref 5–15)
BUN: 17 mg/dL (ref 6–20)
CO2: 25 mmol/L (ref 22–32)
Calcium: 8.5 mg/dL — ABNORMAL LOW (ref 8.9–10.3)
Chloride: 102 mmol/L (ref 98–111)
Creatinine, Ser: 0.91 mg/dL (ref 0.61–1.24)
GFR, Estimated: >60 mL/min (ref >60)
Glucose, Bld: 112 mg/dL — ABNORMAL HIGH (ref 70–99)
Potassium: 3.9 mmol/L (ref 3.5–5.1)
Sodium: 134 mmol/L — ABNORMAL LOW (ref 135–145)

## 2021-01-26 LAB — CBC
HCT: 45.4 % (ref 39.0–52.0)
Hemoglobin: 14.9 g/dL (ref 13.0–17.0)
MCH: 28.9 pg (ref 26.0–34.0)
MCHC: 32.8 g/dL (ref 30.0–36.0)
MCV: 88 fL (ref 80.0–100.0)
Platelets: 223 10*3/uL (ref 150–400)
RBC: 5.16 MIL/uL (ref 4.22–5.81)
RDW: 13.6 % (ref 11.5–15.5)
WBC: 11.5 10*3/uL — ABNORMAL HIGH (ref 4.0–10.5)
nRBC: 0 % (ref 0.0–0.2)

## 2021-01-26 LAB — MRSA PCR SCREENING: MRSA by PCR: NEGATIVE

## 2021-01-26 LAB — SARS CORONAVIRUS 2 (TAT 6-24 HRS): SARS Coronavirus 2: NEGATIVE

## 2021-01-26 LAB — HIV ANTIBODY (ROUTINE TESTING W REFLEX): HIV Screen 4th Generation wRfx: NONREACTIVE

## 2021-01-26 MED ORDER — HYDROCODONE-ACETAMINOPHEN 5-325 MG PO TABS: 1.0000 | ORAL_TABLET | Freq: Four times a day (QID) | ORAL | 0 refills | Status: DC | PRN

## 2021-01-26 MED ORDER — DOXYCYCLINE HYCLATE 100 MG PO TABS: 100.0000 mg | ORAL_TABLET | Freq: Two times a day (BID) | ORAL | 0 refills | Status: AC

## 2021-01-26 MED ORDER — CEFDINIR 300 MG PO CAPS: 300.0000 mg | ORAL_CAPSULE | Freq: Two times a day (BID) | ORAL | 0 refills | Status: AC

## 2021-01-26 MED ORDER — ACETAMINOPHEN 325 MG PO TABS: 650.0000 mg | ORAL_TABLET | Freq: Four times a day (QID) | ORAL | 0 refills | Status: DC | PRN

## 2021-01-26 MED ORDER — SODIUM CHLORIDE 0.9 % IV SOLN
1.0000 g | Freq: Once | INTRAVENOUS | Status: AC
  Administered 2021-01-26: 1 g via INTRAVENOUS
  Filled 2021-01-26: qty 10

## 2021-01-26 NOTE — Progress Notes (Signed)
Patient already on home CPAP unit when RT arrived to check on patient.

## 2021-01-26 NOTE — Discharge Summary (Signed)
Francisco Adams, is a 50 y.o. male  DOB Feb 11, 1971  MRN [id number].  Admission date:  01/25/2021  Admitting Physician  Onnie Boer, MD  Discharge Date:  01/26/2021   Primary MD  Gwenlyn Fudge, FNP  Recommendations for primary care physician for things to follow:   [name] Developed Cellulitis (skin and soft tissue infection)  of Right Leg/Foot after work related Injury---Work Related Rt Leg/Foot Injury --DOI-12/29/2020---- -Treated as inpatient/in the hospital with IV antibiotics -Please take oral antibiotics as prescribed  -Follow-up with Workmen's Comp. physician within a week for recheck and reevaluation  Admission Diagnosis  Cellulitis of right leg [L03.115] Cellulitis of right lower extremity [L03.115]   Discharge Diagnosis  Cellulitis of right leg [L03.115] Cellulitis of right lower extremity [L03.115]   Principal Problem:   Cellulitis of Right Leg/Foot Post work related Injury---Work Related Rt Leg/Foot Injury --DOI-12/29/2020 Active Problems:   Foot trauma, right, sequela----Work Related Rt Leg/Foot Injury --DOI-12/29/2020      Past Medical History:  Diagnosis Date  . Arthritis   . Dermatitis    head  and face   . Palpitations   . Sleep apnea    cpap   . Testosterone deficiency     Past Surgical History:  Procedure Laterality Date  . ANKLE RECONSTRUCTION Right   . SPINE SURGERY     cervical - 3 disc fusions     HPI  from the history and physical done on the day of admission:     Chief Complaint: Right Leg pain and swelling  HPI: Francisco Adams is a 50 y.o. male with medical history significant for hypertension, obstructive sleep apnea. Patient presented to the ED with complaints of leg pain redness and swelling, started 4 days ago.  On 7 April almost a month ago, and 800 pounds table fell on patient's right foot.  He initially had pain, and at some point  there were blisters which healed.  He had an x-ray and was told there might be some broken bones. Reports temperatures of up to 102 at home.  No vomiting.  ED Course: Temperature 99.3, heart rate 91-94.  Respiratory 18.  History of systolic 130s to 846N.  O2 sats greater than 96% on room air.  Lactic acid 1.4.  WBC 9.5.  Right lower extremity venous Dopplers negative.  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  No osteomyelitis myositis or septic joint.  Subcutaneous edema of the lower leg compatible with dependent change of cellulitis. IV vancomycin started. EDP talked with orthopedist-Dr. Dallas Schimke, did not feel surgical intervention was emergently needed, patient can follow-up in clinic.  Review of Systems: As per HPI all other systems reviewed and negative     Hospital Course:      1)Right lower extremity cellulitis secondary to work-related injury--- -Francisco Adams Developed Cellulitis (skin and soft tissue infection)  of Right Leg/Foot after work related  Injury---Work Related Rt Leg/Foot Injury --DOI-12/29/2020---- -Treated as inpatient/in the hospital with IV antibiotics -Please take oral antibiotics including Omnicef and doxycycline as prescribed -Follow-up with Workmen's Comp. physician within a week for recheck and reevaluation  2)HTN/morbid obesity--follow-up with PCP for further management  Discharge Condition: stable  Follow UP   Follow-up Information    Gwenlyn FudgeJoyce, Britney F, FNP Follow up.   Specialty: Family Medicine Contact information: [address] [place] [id number] [phone]                Consults obtained - na  Diet and Activity recommendation:  As advised  Discharge Instructions    Discharge Instructions    Call MD for:  difficulty breathing, headache or visual disturbances   Complete by: As directed    Call MD for:   persistant dizziness or light-headedness   Complete by: As directed    Call MD for:  persistant nausea and vomiting   Complete by: As directed    Call MD for:  severe uncontrolled pain   Complete by: As directed    Call MD for:  temperature >100.4   Complete by: As directed    Diet - low sodium heart healthy   Complete by: As directed    Discharge instructions   Complete by: As directed    Francisco ProKenneth W Adams Developed Cellulitis (skin and soft tissue infection)  of Right Leg/Foot after work related Injury---Work Related Rt Leg/Foot Injury --DOI-12/29/2020---- -Treated as inpatient/in the hospital with IV antibiotics -Please take oral antibiotics as prescribed  -Follow-up with Workmen's Comp. physician within a week for recheck and reevaluation   Increase activity slowly   Complete by: As directed        Discharge Medications     Allergies as of 01/26/2021   No Known Allergies     Medication List    STOP taking these medications   ADVIL PO   meloxicam 15 MG tablet Commonly known as: MOBIC     TAKE these medications   acetaminophen 325 MG tablet Commonly known as: TYLENOL Take 2 tablets (650 mg total) by mouth every 6 (six) hours as needed for mild pain, fever or headache (or Fever >/= 101). What changed:   medication strength  how much to take  reasons to take this   cefdinir 300 MG capsule Commonly known as: OMNICEF Take 1 capsule (300 mg total) by mouth 2 (two) times daily for 5 days.   clotrimazole-betamethasone cream Commonly known as: LOTRISONE Apply 1 application topically 2 (two) times daily.   dextromethorphan-guaiFENesin 30-600 MG 12hr tablet Commonly known as: MUCINEX DM Take 1 tablet by mouth 2 (two) times daily.   doxycycline 100 MG tablet Commonly known as: VIBRA-TABS Take 1 tablet (100 mg total) by mouth 2 (two) times daily for 5 days.   fexofenadine 180 MG tablet Commonly known as: ALLEGRA Take 1 tablet (180 mg total) by mouth daily.    furosemide 20 MG tablet Commonly known as: Lasix Take 1 tablet (20 mg total) by mouth daily.   HYDROcodone-acetaminophen 5-325 MG tablet Commonly known as: NORCO/VICODIN Take 1 tablet by mouth every 6 (six) hours as needed for severe pain. What changed: reasons to take this   metoprolol succinate 25 MG 24 hr tablet Commonly known as: TOPROL-XL Take 1 tablet by mouth once daily   multivitamin tablet Take 1 tablet by mouth daily.   olmesartan 20 MG tablet Commonly known as: BENICAR Take 1 tablet by mouth once daily  ondansetron 4 MG tablet Commonly known as: Zofran Take 1 tablet (4 mg total) by mouth every 8 (eight) hours as needed for nausea or vomiting.   sildenafil 100 MG tablet Commonly known as: VIAGRA Take 1 tablet (100 mg total) by mouth daily as needed for erectile dysfunction.   testosterone cypionate 200 MG/ML injection Commonly known as: DEPOTESTOSTERONE CYPIONATE Inject 0.5 mLs (100 mg total) into the muscle every 14 (fourteen) days.   Vitamin D (Cholecalciferol) 10 MCG (400 UNIT) Caps Take 1 capsule by mouth daily.      Major procedures and Radiology Reports - PLEASE review detailed and final reports for all details, in brief -    CT FOOT RIGHT W CONTRAST  Result Date: 01/25/2021 CLINICAL DATA:  Right lower leg pain, swelling and redness for 1 month. EXAM: CT OF THE LOWER RIGHT EXTREMITY WITH CONTRAST TECHNIQUE: Multidetector CT imaging of the lower right extremity was performed according to the standard protocol following intravenous contrast administration. CONTRAST:  75 mL OMNIPAQUE IOHEXOL 300 MG/ML  SOLN COMPARISON:  None. FINDINGS: Bones/Joint/Cartilage No acute bony abnormality is seen. No bony destructive change or periosteal reaction to suggest osteomyelitis. 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 An osteochondral lesion of the talar dome measures 1.3 cm transverse by 1.9 cm craniocaudal. 2-3 loose bone fragments are seen with the lesion and it  has a central depression of up to 0.7 cm. There is degenerative change about the tibiotalar joint with osteophytosis seen off the anterior lip of the tibia. Ligaments Suboptimally assessed by CT. Muscles and Tendons Intact.  No intramuscular fluid collection or gas. Soft tissues There is stranding in subcutaneous tissues of the lower leg and foot. No radiopaque foreign body or soft tissue gas. IMPRESSION: Subcutaneous edema about the lower leg and foot compatible with dependent change and/or cellulitis. Negative for CT evidence of osteomyelitis, myositis or septic joint. 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 Tibiotalar osteoarthritis noted. Electronically Signed   By: Drusilla Kanner M.D.   On: 01/25/2021 17:29   CT TIBIA FIBULA RIGHT W CONTRAST  Result Date: 01/25/2021 CLINICAL DATA:  Right lower leg pain, swelling and redness for 1 month. EXAM: CT OF THE LOWER RIGHT EXTREMITY WITH CONTRAST TECHNIQUE: Multidetector CT imaging of the lower right extremity was performed according to the standard protocol following intravenous contrast administration. CONTRAST:  75 mL OMNIPAQUE IOHEXOL 300 MG/ML  SOLN COMPARISON:  None. FINDINGS: Bones/Joint/Cartilage No acute bony abnormality is seen. No bony destructive change or periosteal reaction to suggest osteomyelitis. 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 An osteochondral lesion of the talar dome measures 1.3 cm transverse by 1.9 cm craniocaudal. 2-3 loose bone fragments are seen with the lesion and it has a central depression of up to 0.7 cm. There is degenerative change about the tibiotalar joint with osteophytosis seen off the anterior lip of the tibia. Ligaments Suboptimally assessed by CT. Muscles and Tendons Intact.  No intramuscular fluid collection or gas. Soft tissues There is stranding in subcutaneous tissues of the lower leg and foot. No radiopaque foreign  body or soft tissue gas. IMPRESSION: Subcutaneous edema about the lower leg and foot compatible with dependent change and/or cellulitis. Negative for CT evidence of osteomyelitis, myositis or septic joint. 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 Tibiotalar osteoarthritis noted. Electronically Signed  By: Drusilla Kanner M.D.   On: 01/25/2021 17:29   US Venous Img Lower Unilateral Right  Result Date: 01/25/2021 CLINICAL DATA:  50 year old male with right lower extremity pain, redness, and swelling for 1 month. EXAM: RIGHT LOWER EXTREMITY VENOUS DOPPLER ULTRASOUND TECHNIQUE: Gray-scale sonography with graded compression, as well as color Doppler and duplex ultrasound were performed to evaluate the right lower extremity deep venous systems from the level of the common femoral vein and including the common femoral, femoral, profunda femoral, popliteal and calf veins including the posterior tibial, peroneal and gastrocnemius veins when visible. Spectral Doppler was utilized to evaluate flow at rest and with distal augmentation maneuvers in the common femoral, femoral and popliteal veins. The contralateral common femoral vein was also evaluated for comparison. COMPARISON:  None. FINDINGS: RIGHT LOWER EXTREMITY Common Femoral Vein: No evidence of thrombus. Normal compressibility, respiratory phasicity and response to augmentation. Central Greater Saphenous Vein: No evidence of thrombus. Normal compressibility and flow on color Doppler imaging. Central Profunda Femoral Vein: No evidence of thrombus. Normal compressibility and flow on color Doppler imaging. Femoral Vein: No evidence of thrombus. Normal compressibility, respiratory phasicity and response to augmentation. Popliteal Vein: No evidence of thrombus. Normal compressibility, respiratory phasicity and response to augmentation. Calf Veins: No evidence of thrombus. Normal  compressibility and flow on color Doppler imaging. Other Findings: Several enlarged right inguinal lymph nodes, likely reactive. Diffuse calf edema is noted. LEFT LOWER EXTREMITY Common Femoral Vein: No evidence of thrombus. Normal compressibility, respiratory phasicity and response to augmentation. IMPRESSION: 1. No evidence of right lower extremity deep venous thrombosis. 2. Diffuse subcutaneous calf edema. 3. Enlarged right inguinal lymph nodes, likely reactive. Marliss Coots, MD [facility] Radiology Electronically Signed   By: Marliss Coots MD   On: 01/25/2021 14:40    Micro Results   Recent Results (from the past 240 hour(s))  Novel Coronavirus, NAA ([facility])     Status: None   Collection Time: 01/23/21 10:45 AM   Specimen: Nasopharyngeal(NP) swabs in vial transport medium  Result Value Ref Range Status   SARS-CoV-2, NAA Not Detected Not Detected Final    Comment: This nucleic acid amplification test was developed and its performance characteristics determined by [employer]. Nucleic acid amplification tests include RT-PCR and TMA. This test has not been FDA cleared or approved. This test has been authorized by FDA under an Emergency Use Authorization (EUA). This test is only authorized for the duration of time the declaration that circumstances exist justifying the authorization of the emergency use of in vitro diagnostic tests for detection of SARS-CoV-2 virus and/or diagnosis of COVID-19 infection under section 564(b)(1) of the Act, 21 U.S.C. 638VFI-4(P) (1), unless the authorization is terminated or revoked sooner. When diagnostic testing is negative, the possibility of a false negative result should be considered in the context of a patient's recent exposures and the presence of clinical signs and symptoms consistent with COVID-19. An individual without symptoms of COVID-19 and who is not shedding SARS-CoV-2 virus wo uld  expect to have a negative (not detected) result in this assay.   SARS CORONAVIRUS 2 (TAT 6-24 HRS) Nasopharyngeal Nasopharyngeal Swab     Status: None   Collection Time: 01/25/21  3:45 PM   Specimen: Nasopharyngeal Swab  Result Value Ref Range Status   SARS Coronavirus 2 NEGATIVE NEGATIVE Final    Comment: (NOTE) SARS-CoV-2 target nucleic acids are NOT DETECTED.  The SARS-CoV-2 RNA is generally detectable in upper and lower respiratory specimens  during the acute phase of infection. Negative results do not preclude SARS-CoV-2 infection, do not rule out co-infections with other pathogens, and should not be used as the sole basis for treatment or other patient management decisions. Negative results must be combined with clinical observations, patient history, and epidemiological information. The expected result is Negative.  Fact Sheet for Patients: HairSlick.no  Fact Sheet for Healthcare Providers: quierodirigir.com  This test is not yet approved or cleared by the [place] FDA and  has been authorized for detection and/or diagnosis of SARS-CoV-2 by FDA under an Emergency Use Authorization (EUA). This EUA will remain  in effect (meaning this test can be used) for the duration of the COVID-19 declaration under Se ction 564(b)(1) of the Act, 21 U.S.C. section 360bbb-3(b)(1), unless the authorization is terminated or revoked sooner.  Performed at [facility], [address] [place], [place] [address]   MRSA PCR Screening     Status: None   Collection Time: 01/26/21  8:42 AM   Specimen: Nasal Mucosa; Nasopharyngeal  Result Value Ref Range Status   MRSA by PCR NEGATIVE NEGATIVE Final    Comment:        The GeneXpert MRSA Assay (FDA approved for NASAL specimens only), is one component of a comprehensive MRSA colonization surveillance program. It is not intended to diagnose MRSA infection nor to guide or monitor  treatment for MRSA infections. Performed at [facility], [address], [place], [place] [address]      Today   Subjective    Francisco Adams today has no fevers or chills, -Right leg pain and swelling is improving      Patient has been seen and examined prior to discharge   Objective   Blood pressure 132/82, pulse 81, temperature (!) 97.5 F (36.4 C), resp. rate 20, height 6' (1.829 m), weight (!) 170.1 kg, SpO2 99 %.   Intake/Output Summary (Last 24 hours) at 01/26/2021 1224 Last data filed at 01/26/2021 0900 Gross per 24 hour  Intake 554.09 ml  Output --  Net 554.09 ml    Exam Gen:- Awake Alert, no acute distress, morbidly obese HEENT:- [place].AT, No sclera icterus Neck-Supple Neck,No JVD,.  Lungs-  CTAB , good air movement bilaterally  CV- S1, S2 normal, regular Abd-  +ve B.Sounds, Abd Soft, No tenderness, increased truncal adiposity ExtremitySkin:-    good pulses, much improved erythema, much improved swelling, much improved tenderness, no streaking, -No fluctuance, no open wounds or sores, no drainage, please see photos in epic Psych-affect is appropriate, oriented x3 Neuro-no new focal deficits, no tremors    Data Review   CBC w Diff:  Lab Results  Component Value Date   WBC 11.5 (H) 01/26/2021   HGB 14.9 01/26/2021   HGB 16.2 06/24/2020   HCT 45.4 01/26/2021   HCT 47.8 06/24/2020   PLT 223 01/26/2021   PLT 259 06/24/2020   LYMPHOPCT 17 01/25/2021   MONOPCT 12 01/25/2021   EOSPCT 2 01/25/2021   BASOPCT 1 01/25/2021    CMP:  Lab Results  Component Value Date   NA 134 (L) 01/26/2021   NA 137 06/24/2020   K 3.9 01/26/2021   CL 102 01/26/2021   CO2 25 01/26/2021   BUN 17 01/26/2021   BUN 14 06/24/2020   CREATININE 0.91 01/26/2021   PROT 7.0 06/24/2020   ALBUMIN 4.3 06/24/2020   BILITOT <0.2 06/24/2020   ALKPHOS 70 06/24/2020   AST 18 06/24/2020   ALT 24 06/24/2020  Total Discharge time is about 33 minutes  Shon Hale M.D on 01/26/2021 at  12:24 PM  Go to [email or web address] -  for contact info  [facility] - Office  [phone]

## 2021-01-26 NOTE — Plan of Care (Signed)

## 2021-01-26 NOTE — Discharge Instructions (Signed)
Francisco Adams Developed Cellulitis (skin and soft tissue infection)  of Right Leg/Foot after work related Injury---Work Related Rt Leg/Foot Injury --DOI-12/29/2020---- -Treated as inpatient/in the hospital with IV antibiotics -Please take oral antibiotics as prescribed  -Follow-up with Workmen's Comp. physician within a week for recheck and reevaluation

## 2021-01-30 ENCOUNTER — Other Ambulatory Visit: Payer: Self-pay | Admitting: *Deleted

## 2021-01-30 DIAGNOSIS — R600 Localized edema: Secondary | ICD-10-CM

## 2021-01-30 MED ORDER — FUROSEMIDE 20 MG PO TABS: 20.0000 mg | ORAL_TABLET | Freq: Every day | ORAL | 0 refills | Status: DC

## 2021-03-09 ENCOUNTER — Other Ambulatory Visit: Payer: Self-pay | Admitting: Family Medicine

## 2021-03-09 DIAGNOSIS — E291 Testicular hypofunction: Secondary | ICD-10-CM

## 2021-03-09 NOTE — Telephone Encounter (Signed)
Patient aware and states he will call back to schedule an appointment.

## 2021-03-09 NOTE — Telephone Encounter (Signed)
Testosterone denied - pt is over due for 6 mo appt - please call and set up

## 2021-04-07 ENCOUNTER — Ambulatory Visit (INDEPENDENT_AMBULATORY_CARE_PROVIDER_SITE_OTHER): Payer: 59 | Admitting: Family Medicine

## 2021-04-07 ENCOUNTER — Other Ambulatory Visit: Payer: Self-pay

## 2021-04-07 ENCOUNTER — Encounter: Payer: Self-pay | Admitting: Family Medicine

## 2021-04-07 VITALS — BP 140/87 | HR 82 | Temp 98.5°F | Ht 73.0 in | Wt 376.2 lb

## 2021-04-07 DIAGNOSIS — E291 Testicular hypofunction: Secondary | ICD-10-CM

## 2021-04-07 DIAGNOSIS — M7712 Lateral epicondylitis, left elbow: Secondary | ICD-10-CM

## 2021-04-07 DIAGNOSIS — R600 Localized edema: Secondary | ICD-10-CM | POA: Diagnosis not present

## 2021-04-07 DIAGNOSIS — I10 Essential (primary) hypertension: Secondary | ICD-10-CM

## 2021-04-07 DIAGNOSIS — E781 Pure hyperglyceridemia: Secondary | ICD-10-CM

## 2021-04-07 DIAGNOSIS — N529 Male erectile dysfunction, unspecified: Secondary | ICD-10-CM

## 2021-04-07 DIAGNOSIS — Z0000 Encounter for general adult medical examination without abnormal findings: Secondary | ICD-10-CM

## 2021-04-07 DIAGNOSIS — L719 Rosacea, unspecified: Secondary | ICD-10-CM

## 2021-04-07 MED ORDER — PREDNISONE 10 MG (21) PO TBPK: ORAL_TABLET | ORAL | 0 refills | Status: DC

## 2021-04-07 MED ORDER — FUROSEMIDE 20 MG PO TABS: 20.0000 mg | ORAL_TABLET | Freq: Every day | ORAL | 1 refills | Status: DC

## 2021-04-07 MED ORDER — METOPROLOL SUCCINATE ER 25 MG PO TB24: 25.0000 mg | ORAL_TABLET | Freq: Every day | ORAL | 1 refills | Status: DC

## 2021-04-07 MED ORDER — OLMESARTAN MEDOXOMIL 40 MG PO TABS: 40.0000 mg | ORAL_TABLET | Freq: Every day | ORAL | 2 refills | Status: DC

## 2021-04-07 NOTE — Patient Instructions (Signed)
Complete your Cologuard.

## 2021-04-07 NOTE — Progress Notes (Signed)
Assessment & Plan:  1. Essential hypertension Uncontrolled. Benicar increased from 20 mg to 40 mg once daily. - metoprolol succinate (TOPROL-XL) 25 MG 24 hr tablet; Take 1 tablet (25 mg total) by mouth daily.  Dispense: 90 tablet; Refill: 1 - olmesartan (BENICAR) 40 MG tablet; Take 1 tablet (40 mg total) by mouth daily.  Dispense: 30 tablet; Refill: 2 - CMP14+EGFR; Future - CBC with Differential/Platelet; Future - Lipid panel; Future  2. Hypertriglyceridemia Labs to assess. - Lipid panel; Future  3. Hypogonadism in male Restarted Testosterone at previous dose (needs lab work). Advised to return 1-2 weeks before his next appointment in 3 months for lab work the week he doesn't take the testosterone. Referral placed to urology as patient has never been seen by a specialist regarding his low testosterone.  - ToxASSURE Select 13 (MW), Urine - Ambulatory referral to Urology - Testosterone,Free and Total; Future - CBC with Differential/Platelet; Future - sildenafil (VIAGRA) 100 MG tablet; Take 1 tablet (100 mg total) by mouth daily as needed for erectile dysfunction.  Dispense: 30 tablet; Refill: 2  4. Localized edema Well controlled on current regimen.  - furosemide (LASIX) 20 MG tablet; Take 1 tablet (20 mg total) by mouth daily.  Dispense: 90 tablet; Refill: 1  5. Rosacea Well controlled on current regimen.  - clotrimazole-betamethasone (LOTRISONE) cream; Apply 1 application topically 2 (two) times daily.  Dispense: 30 g; Refill: 3  6. Left tennis elbow Education provided on Tennis Elbow. - predniSONE (STERAPRED UNI-PAK 21 TAB) 10 MG (21) TBPK tablet; As directed x 6 days  Dispense: 21 tablet; Refill: 0  7. Primary erectile dysfunction Well controlled on current regimen.  - sildenafil (VIAGRA) 100 MG tablet; Take 1 tablet (100 mg total) by mouth daily as needed for erectile dysfunction.  Dispense: 30 tablet; Refill: 2  8. Healthcare maintenance Encouraged to complete Cologuard  that he has at home.    Return in about 3 months (around 07/08/2021) for annual physical.  [name], MSN, APRN, FNP-C [facility]  Subjective:    Patient ID: Francisco Adams, male    DOB: 1970/10/23, 50 y.o.   MRN: [id number]  Patient Care Team: Loman Brooklyn, FNP as PCP - General (Family Medicine)   Chief Complaint:  Chief Complaint  Patient presents with   Hypertension   Hyperlipidemia    Check up of chronic medical conditions    Elbow Pain    Left that has been going on a few months daily     HPI: Francisco Adams is a 50 y.o. male presenting on 04/07/2021 for Hypertension, Hyperlipidemia (Check up of chronic medical conditions ), and Elbow Pain (Left that has been going on a few months daily )  Hypertension: patient reports his blood pressure at home is normally 140s/80-90s.   Hypogonadism: patient has been out of his testosterone x6 weeks. He was giving himself 100 mg every 14 days as prescribed. He is wondering if he can use the medication weekly as he reports he starts to feel bad again after 4-5 days. He has never seen endocrinology or urology.   New complaints: Patient reports left elbow pain for the past few months. It occurs daily.   Social history:  Relevant past medical, surgical, family and social history reviewed and updated as indicated. Interim medical history since our last visit reviewed.  Allergies and medications reviewed and updated.  DATA REVIEWED: CHART IN EPIC  ROS: Negative unless specifically indicated above in HPI.  Current Outpatient Medications:    clotrimazole-betamethasone (LOTRISONE) cream, Apply 1 application topically 2 (two) times daily., Disp: 30 g, Rfl: 3   furosemide (LASIX) 20 MG tablet, Take 1 tablet (20 mg total) by mouth daily. (NEEDS TO BE SEEN BEFORE NEXT REFILL), Disp: 30 tablet, Rfl: 0   metoprolol succinate (TOPROL-XL) 25 MG 24 hr tablet, Take 1 tablet by mouth once daily, Disp: 90 tablet,  Rfl: 0   Multiple Vitamin (MULTIVITAMIN) tablet, Take 1 tablet by mouth daily., Disp: , Rfl:    olmesartan (BENICAR) 20 MG tablet, Take 1 tablet by mouth once daily, Disp: 90 tablet, Rfl: 0   sildenafil (VIAGRA) 100 MG tablet, Take 1 tablet (100 mg total) by mouth daily as needed for erectile dysfunction., Disp: 30 tablet, Rfl: 2   testosterone cypionate (DEPOTESTOSTERONE CYPIONATE) 200 MG/ML injection, Inject 0.5 mLs (100 mg total) into the muscle every 14 (fourteen) days., Disp: 10 mL, Rfl: 1   Vitamin D, Cholecalciferol, 10 MCG (400 UNIT) CAPS, Take 1 capsule by mouth daily., Disp: , Rfl:    No Known Allergies Past Medical History:  Diagnosis Date   Arthritis    Dermatitis    head  and face    Palpitations    Sleep apnea    cpap    Testosterone deficiency     Past Surgical History:  Procedure Laterality Date   ANKLE RECONSTRUCTION Right    SPINE SURGERY     cervical - 3 disc fusions    Social History   Socioeconomic History   Marital status: Divorced    Spouse name: Not on file   Number of children: 3   Years of education: Not on file   Highest education level: Not on file  Occupational History   Not on file  Tobacco Use   Smoking status: Former   Smokeless tobacco: Current    Types: Chew  Vaping Use   Vaping Use: Never used  Substance and Sexual Activity   Alcohol use: Never   Drug use: Never   Sexual activity: Not on file  Other Topics Concern   Not on file  Social History Narrative   Not on file   Social Determinants of Health   Financial Resource Strain: Not on file  Food Insecurity: Not on file  Transportation Needs: Not on file  Physical Activity: Not on file  Stress: Not on file  Social Connections: Not on file  Intimate Partner Violence: Not on file        Objective:    BP 140/87   Pulse 82   Temp 98.5 F (36.9 C) (Temporal)   Ht _0  (1.854 m)   Wt (!) 376 lb 3.2 oz (170.6 kg)   SpO2 95%   BMI 49.63 kg/m   Wt Readings from Last 3  Encounters:  04/07/21 (!) 376 lb 3.2 oz (170.6 kg)  01/25/21 (!) 375 lb (170.1 kg)  11/04/20 (!) 376 lb 3.2 oz (170.6 kg)    Physical Exam Vitals reviewed.  Constitutional:      General: He is not in acute distress.    Appearance: Normal appearance. He is morbidly obese. He is not ill-appearing, toxic-appearing or diaphoretic.  HENT:     Head: Normocephalic and atraumatic.  Eyes:     General: No scleral icterus.       Right eye: No discharge.        Left eye: No discharge.     Conjunctiva/sclera: Conjunctivae normal.  Cardiovascular:  Rate and Rhythm: Normal rate and regular rhythm.     Heart sounds: Normal heart sounds. No murmur heard.   No friction rub. No gallop.  Pulmonary:     Effort: Pulmonary effort is normal. No respiratory distress.     Breath sounds: Normal breath sounds. No stridor. No wheezing, rhonchi or rales.  Musculoskeletal:        General: Normal range of motion.     Left elbow: No swelling, deformity, effusion or lacerations. Tenderness present in lateral epicondyle.     Cervical back: Normal range of motion.  Skin:    General: Skin is warm and dry.  Neurological:     Mental Status: He is alert and oriented to person, place, and time. Mental status is at baseline.  Psychiatric:        Mood and Affect: Mood normal.        Behavior: Behavior normal.        Thought Content: Thought content normal.        Judgment: Judgment normal.    Lab Results  Component Value Date   TSH 2.240 06/24/2020   Lab Results  Component Value Date   WBC 11.5 (H) 01/26/2021   HGB 14.9 01/26/2021   HCT 45.4 01/26/2021   MCV 88.0 01/26/2021   PLT 223 01/26/2021   Lab Results  Component Value Date   NA 134 (L) 01/26/2021   K 3.9 01/26/2021   CO2 25 01/26/2021   GLUCOSE 112 (H) 01/26/2021   BUN 17 01/26/2021   CREATININE 0.91 01/26/2021   BILITOT <0.2 06/24/2020   ALKPHOS 70 06/24/2020   AST 18 06/24/2020   ALT 24 06/24/2020   PROT 7.0 06/24/2020   ALBUMIN 4.3  06/24/2020   CALCIUM 8.5 (L) 01/26/2021   ANIONGAP 7 01/26/2021   Lab Results  Component Value Date   CHOL 150 06/24/2020   Lab Results  Component Value Date   HDL 33 (L) 06/24/2020   Lab Results  Component Value Date   LDLCALC 94 06/24/2020   Lab Results  Component Value Date   TRIG 128 06/24/2020   Lab Results  Component Value Date   CHOLHDL 4.5 06/24/2020   No results found for: HGBA1C

## 2021-04-10 MED ORDER — SILDENAFIL CITRATE 100 MG PO TABS: 100.0000 mg | ORAL_TABLET | Freq: Every day | ORAL | 2 refills | Status: DC | PRN

## 2021-04-10 MED ORDER — CLOTRIMAZOLE-BETAMETHASONE 1-0.05 % EX CREA
1.0000 | TOPICAL_CREAM | Freq: Two times a day (BID) | CUTANEOUS | 3 refills | Status: DC
Start: 2021-04-10 — End: 2022-05-14

## 2021-04-11 ENCOUNTER — Telehealth: Payer: Self-pay

## 2021-04-11 DIAGNOSIS — E291 Testicular hypofunction: Secondary | ICD-10-CM

## 2021-04-11 NOTE — Telephone Encounter (Signed)
Tell patient to use a GoodRx discount at [employer].

## 2021-04-11 NOTE — Telephone Encounter (Signed)
Received a PA for Sildenafil.  Would like you like to change to something else or do PA?  Key- [id number]

## 2021-04-12 LAB — TOXASSURE SELECT 13 (MW), URINE

## 2021-04-12 MED ORDER — TESTOSTERONE CYPIONATE 200 MG/ML IM SOLN: 100.0000 mg | INTRAMUSCULAR | 1 refills | Status: DC

## 2021-04-12 NOTE — Telephone Encounter (Signed)
Yes, I did say I would refill this. I just sent the refill.

## 2021-04-12 NOTE — Addendum Note (Signed)
Addended by: Gwenlyn Fudge on: 04/12/2021 05:39 PM   Modules accepted: Orders

## 2021-04-12 NOTE — Telephone Encounter (Signed)
Patient aware.

## 2021-04-12 NOTE — Telephone Encounter (Signed)
Patient states you were supposed to sent in his testosterone injection since he had been off of it x 4 months and then he was going to come in in October.

## 2021-04-14 ENCOUNTER — Telehealth: Payer: Self-pay | Admitting: *Deleted

## 2021-04-14 DIAGNOSIS — E291 Testicular hypofunction: Secondary | ICD-10-CM

## 2021-04-14 NOTE — Telephone Encounter (Signed)
(  Key: [id number]) Testosterone Cypionate 200MG /ML intramuscular solution  Pa started - sent to plan with lab record attached

## 2021-04-25 NOTE — Telephone Encounter (Signed)
Approved on July 31 CaseId:[id number];Status:Approved;Review Type:Prior Auth;Coverage Start Date:03/15/2021;Coverage End Date:04/23/2022; Drug Testosterone Cypionate 200MG /ML intramuscular solution  [place] aware

## 2021-06-12 ENCOUNTER — Other Ambulatory Visit: Payer: Self-pay

## 2021-06-12 ENCOUNTER — Encounter: Payer: Self-pay | Admitting: Urology

## 2021-06-12 ENCOUNTER — Ambulatory Visit (INDEPENDENT_AMBULATORY_CARE_PROVIDER_SITE_OTHER): Payer: 59 | Admitting: Urology

## 2021-06-12 VITALS — BP 129/85 | HR 86 | Ht 72.0 in | Wt 376.5 lb

## 2021-06-12 DIAGNOSIS — E291 Testicular hypofunction: Secondary | ICD-10-CM | POA: Diagnosis not present

## 2021-06-12 LAB — URINALYSIS, ROUTINE W REFLEX MICROSCOPIC
Bilirubin, UA: NEGATIVE
Glucose, UA: NEGATIVE
Ketones, UA: NEGATIVE
Leukocytes,UA: NEGATIVE
Nitrite, UA: NEGATIVE
Protein,UA: NEGATIVE
Specific Gravity, UA: 1.025 (ref 1.005–1.030)
Urobilinogen, Ur: 0.2 mg/dL (ref 0.2–1.0)
pH, UA: 5.5 (ref 5.0–7.5)

## 2021-06-12 LAB — MICROSCOPIC EXAMINATION
Bacteria, UA: NONE SEEN
Epithelial Cells (non renal): NONE SEEN /hpf (ref 0–10)
RBC, Urine: NONE SEEN /hpf (ref 0–2)
Renal Epithel, UA: NONE SEEN /hpf
WBC, UA: NONE SEEN /hpf (ref 0–5)

## 2021-06-12 NOTE — Progress Notes (Signed)
06/12/2021 9:57 AM   Francisco Adams June 17, 1971 [id number]  Referring provider: Gwenlyn Fudge, FNP [address] [place],  [place] [address]  No chief complaint on file.   HPI:  New pt -   1) low testosterone-patient had a testosterone in September 2020 of 193, November 2021 of 358, and October 2021 367 - he is on T replacement x 10 yrs. He tried gel and now on injections. He does (200 mg/ml) 0.5 ml q 2 weeks. His last injection 06/02/2021 - he is due for an injection in 4 days. He notices the swings and wants to level the cycles and increase the T level. He feels well for a few days after each injection. PSA was 0.5.  Hematocrit 45.    He has three kids and has a vasectomy.   2) ED - He has erectile dysfunction and takes sildenafil.  He has also had fatigue and weight gain.  He does not smoke. Sildenafil works better the few days p an injection.   UA is clear, AUA symptom score 4  He does maintenance in [facility].    PMH: Past Medical History:  Diagnosis Date   Arthritis    Dermatitis    head  and face    Palpitations    Sleep apnea    cpap    Testosterone deficiency     Surgical History: Past Surgical History:  Procedure Laterality Date   ANKLE RECONSTRUCTION Right    SPINE SURGERY     cervical - 3 disc fusions    Home Medications:  Allergies as of 06/12/2021   No Known Allergies      Medication List        Accurate as of June 12, 2021  9:57 AM. If you have any questions, ask your nurse or doctor.          STOP taking these medications    predniSONE 10 MG (21) Tbpk tablet Commonly known as: STERAPRED UNI-PAK 21 TAB Stopped by: Jerilee Field, MD       TAKE these medications    clotrimazole-betamethasone cream Commonly known as: LOTRISONE Apply 1 application topically 2 (two) times daily.   furosemide 20 MG tablet Commonly known as: Lasix Take 1 tablet (20 mg total) by mouth daily.   metoprolol succinate 25 MG 24 hr  tablet Commonly known as: TOPROL-XL Take 1 tablet (25 mg total) by mouth daily.   multivitamin tablet Take 1 tablet by mouth daily.   olmesartan 40 MG tablet Commonly known as: BENICAR Take 1 tablet (40 mg total) by mouth daily.   sildenafil 100 MG tablet Commonly known as: VIAGRA Take 1 tablet (100 mg total) by mouth daily as needed for erectile dysfunction.   testosterone cypionate 200 MG/ML injection Commonly known as: DEPOTESTOSTERONE CYPIONATE Inject 0.5 mLs (100 mg total) into the muscle every 14 (fourteen) days.   Vitamin D (Cholecalciferol) 10 MCG (400 UNIT) Caps Take 1 capsule by mouth daily.        Allergies: No Known Allergies  Family History: Family History  Problem Relation Age of Onset   Alcohol abuse Mother    Hypertension Mother    Heart attack Mother    Alcohol abuse Father    Hypertension Father    Heart attack Father    Seizures Brother    AAA (abdominal aortic aneurysm) Brother    Drug abuse Brother     Social History:  reports that he has quit smoking. His smokeless tobacco  use includes chew. He reports that he does not drink alcohol and does not use drugs.   Physical Exam: BP 129/85   Pulse 86   Ht 6' (1.829 m)   Wt (!) 376 lb 8 oz (170.8 kg)   BMI 51.06 kg/m   Constitutional:  Alert and oriented, No acute distress. HEENT: [place] AT, moist mucus membranes.  Trachea midline, no masses. Cardiovascular: No clubbing, cyanosis, or edema. Respiratory: Normal respiratory effort, no increased work of breathing. GI: Abdomen is soft, nontender, nondistended, no abdominal masses GU: No CVA tenderness Lymph: No cervical or inguinal lymphadenopathy. Skin: No rashes, bruises or suspicious lesions. Neurologic: Grossly intact, no focal deficits, moving all 4 extremities. Psychiatric: Normal mood and affect. GU: Penis circumcised, normal foreskin, testicles descended bilaterally and palpably normal, bilateral epididymis palpably normal, scrotum  normal DRE: Prostate 25 g, smooth without hard area or nodule   Laboratory Data: Lab Results  Component Value Date   WBC 11.5 (H) 01/26/2021   HGB 14.9 01/26/2021   HCT 45.4 01/26/2021   MCV 88.0 01/26/2021   PLT 223 01/26/2021    Lab Results  Component Value Date   CREATININE 0.91 01/26/2021    No results found for: PSA  Lab Results  Component Value Date   TESTOSTERONE 367 06/24/2020    No results found for: HGBA1C  Urinalysis    Component Value Date/Time   COLORURINE YELLOW 10/21/2009 0553   APPEARANCEUR CLEAR 10/21/2009 0553   LABSPEC 1.024 10/21/2009 0553   PHURINE 5.0 10/21/2009 0553   GLUCOSEU NEGATIVE 10/21/2009 0553   HGBUR TRACE (A) 10/21/2009 0553   BILIRUBINUR NEGATIVE 10/21/2009 0553   KETONESUR NEGATIVE 10/21/2009 0553   PROTEINUR NEGATIVE 10/21/2009 0553   UROBILINOGEN 0.2 10/21/2009 0553   NITRITE NEGATIVE 10/21/2009 0553   LEUKOCYTESUR NEGATIVE 10/21/2009 0553    Lab Results  Component Value Date   BACTERIA RARE 10/21/2009    Pertinent Imaging: N/a   Assessment & Plan:    1. Hypogonadism in male We will repeat testosterone, LH and prolactin this morning.  We discussed the nature risk benefits of a trial of clomiphene or testosterone replacement.  We discussed how these treatments differ and how 1 might suppress sperm counts and cause testicular atrophy.  We discussed the risk of major adverse cardiac events such as DVT, PE and MI with testosterone manipulation among others.  I encouraged him to quit smoking and then talk to his primary care physician about an exercise regimen.  All questions answered. - Urinalysis, Routine w reflex microscopic   No follow-ups on file.  Jerilee Field, MD  [facility]  [address] [place], [place] [address] [phone]

## 2021-06-12 NOTE — Progress Notes (Signed)
Urological Symptom Review  Patient is experiencing the following symptoms: Erection problems (male only)   Review of Systems  Gastrointestinal (upper)  : Negative for upper GI symptoms  Gastrointestinal (lower) : Negative for lower GI symptoms  Constitutional : Fatigue  Skin: Skin rash/lesion  Eyes: Negative for eye symptoms  Ear/Nose/Throat : Negative for Ear/Nose/Throat symptoms  Hematologic/Lymphatic: Negative for Hematologic/Lymphatic symptoms  Cardiovascular : Leg swelling  Respiratory : Negative for respiratory symptoms  Endocrine: Negative for endocrine symptoms  Musculoskeletal: Back pain Joint pain  Neurological: Negative for neurological symptoms  Psychologic: Negative for psychiatric symptoms

## 2021-06-15 LAB — TESTOSTERONE, FREE, TOTAL, SHBG
Sex Hormone Binding: 28.4 nmol/L (ref 19.3–76.4)
Testosterone, Free: 5.1 pg/mL — ABNORMAL LOW (ref 7.2–24.0)
Testosterone: 172 ng/dL — ABNORMAL LOW (ref 264–916)

## 2021-06-15 LAB — PROLACTIN: Prolactin: 6.4 ng/mL (ref 4.0–15.2)

## 2021-06-20 ENCOUNTER — Telehealth: Payer: Self-pay

## 2021-06-20 DIAGNOSIS — E291 Testicular hypofunction: Secondary | ICD-10-CM

## 2021-06-20 MED ORDER — TESTOSTERONE CYPIONATE 200 MG/ML IM SOLN: 100.0000 mg | INTRAMUSCULAR | 1 refills | Status: DC

## 2021-06-20 NOTE — Telephone Encounter (Signed)
-----   Message from Jerilee Field, MD sent at 06/20/2021  9:44 AM EDT ----- Let Francisco Adams know his T is low at 174. Which is low for someone on T replacement, so I want him to go to increase the frequency of injections to every week by injecting 0.5 ml (100 mg) every week or every 7 days (instead of every two weeks). Check a T level 1-3 days prior to his third injection. Thanks.   ----- Message ----- From: Gustavus Messing, LPN Sent: 6/55/3748   9:06 AM EDT To: Jerilee Field, MD  Please review

## 2021-06-20 NOTE — Telephone Encounter (Signed)
Patient called and made aware.  I put order in but forget I cannot send sense its considered a control. Patient needs it sent to [facility] in [place] as soon as possible

## 2021-06-21 MED ORDER — TESTOSTERONE CYPIONATE 200 MG/ML IM SOLN: 100.0000 mg | INTRAMUSCULAR | 1 refills | Status: DC

## 2021-06-30 ENCOUNTER — Other Ambulatory Visit: Payer: Worker's Compensation

## 2021-07-11 ENCOUNTER — Other Ambulatory Visit: Payer: Self-pay

## 2021-07-11 ENCOUNTER — Ambulatory Visit (INDEPENDENT_AMBULATORY_CARE_PROVIDER_SITE_OTHER): Payer: 59 | Admitting: Family Medicine

## 2021-07-11 ENCOUNTER — Encounter: Payer: Self-pay | Admitting: Family Medicine

## 2021-07-11 VITALS — BP 136/90 | HR 88 | Temp 98.4°F | Ht 73.0 in | Wt 382.8 lb

## 2021-07-11 DIAGNOSIS — E781 Pure hyperglyceridemia: Secondary | ICD-10-CM | POA: Diagnosis not present

## 2021-07-11 DIAGNOSIS — I10 Essential (primary) hypertension: Secondary | ICD-10-CM

## 2021-07-11 DIAGNOSIS — Z0001 Encounter for general adult medical examination with abnormal findings: Secondary | ICD-10-CM | POA: Diagnosis not present

## 2021-07-11 DIAGNOSIS — R600 Localized edema: Secondary | ICD-10-CM | POA: Diagnosis not present

## 2021-07-11 DIAGNOSIS — E291 Testicular hypofunction: Secondary | ICD-10-CM

## 2021-07-11 DIAGNOSIS — Z0000 Encounter for general adult medical examination without abnormal findings: Secondary | ICD-10-CM

## 2021-07-11 DIAGNOSIS — N529 Male erectile dysfunction, unspecified: Secondary | ICD-10-CM

## 2021-07-11 NOTE — Progress Notes (Signed)
Assessment & Plan:  1. Well adult exam Preventive health education provided. Encouraged to complete Cologuard that he has at home. - CBC with Differential/Platelet - CMP14+EGFR - Lipid panel  2. Essential hypertension Well controlled on current regimen.  - CBC with Differential/Platelet - CMP14+EGFR - Lipid panel  3. Hypertriglyceridemia - Lipid panel  4. Localized edema Well controlled on current regimen.  - CMP14+EGFR  5. Hypogonadism in male Patient to resume testosterone as a refill was sent by his urologist.  6. Primary erectile dysfunction Well controlled on current regimen.   7. Morbid obesity ([place]) Healthy eating and exercise encouraged. - CBC with Differential/Platelet - CMP14+EGFR - Lipid panel   Follow-up: Return in about 6 months (around 01/09/2022) for follow-up of chronic medication conditions.   [name], MSN, APRN, FNP-C [facility]  Subjective:  Patient ID: Francisco Adams, male    DOB: 26-Jul-1971  Age: 50 y.o. MRN: [id number]  Patient Care Team: Loman Brooklyn, FNP as PCP - General (Family Medicine)   CC:  Chief Complaint  Patient presents with   Annual Exam    HPI MAE CIANCI presents for his annual physical.  Work: Therapist, music at [employer] Marital status: Single Substance use: Denies Diet: none, Exercise: none except walking at work Last eye exam: February 2021 Last dental exam: Recent; returning next Friday Last colonoscopy: never - patient has Cologuard at home that he needs to complete Hepatitis C Screening: declined PSA: WNL 06/24/2020 Immunizations: Flu Vaccine: declined Tdap Vaccine: up to date  Shingrix Vaccine: declined  COVID-19 Vaccine: declined  DEPRESSION SCREENING PHQ 2/9 Scores 07/11/2021 04/07/2021 11/04/2020 10/14/2020 09/26/2020 06/24/2020 04/18/2020  PHQ - 2 Score 1 2 0 0 0 0 0  PHQ- 9 Score 7 5 - - - - -     Low Testosterone/Hypogonadism Patient has been on  testosterone injections every other week. At our last visit I referred him to urology to make sure nothing else was going on. They did change his testosterone to weekly so instead of doing 100 mg every other week, he is doing 100 mg weekly. He has been out of his medication for a couple of weeks now.  Erectile Dysfunction Using sildenafil 100 mg as needed.  Hypertension Taking metoprolol and olmesartan daily.  Edema Taking furosemide daily.  Hypertriglyceridemia Taking The 10-year ASCVD risk score (Arnett DK, et al., 2019) is: 5.5%   Values used to calculate the score:     Age: 50 years     Sex: Male     Is Non-Hispanic African American: No     Diabetic: No     Tobacco smoker: No     Systolic Blood Pressure: 163 mmHg     Is BP treated: Yes     HDL Cholesterol: 34 mg/dL     Total Cholesterol: 149 mg/dL   Review of Systems  Constitutional:  Negative for chills, fever, malaise/fatigue and weight loss.  HENT:  Negative for congestion, ear discharge, ear pain, nosebleeds, sinus pain, sore throat and tinnitus.   Eyes:  Negative for blurred vision, double vision, pain, discharge and redness.  Respiratory:  Negative for cough, shortness of breath and wheezing.   Cardiovascular:  Negative for chest pain, palpitations and leg swelling.  Gastrointestinal:  Negative for abdominal pain, constipation, diarrhea, heartburn, nausea and vomiting.  Genitourinary:  Negative for dysuria, frequency and urgency.  Musculoskeletal:  Negative for myalgias.  Skin:  Negative for rash.  Neurological:  Negative for dizziness, seizures,  weakness and headaches.  Psychiatric/Behavioral:  Negative for depression, substance abuse and suicidal ideas. The patient is not nervous/anxious.     Current Outpatient Medications:    clotrimazole-betamethasone (LOTRISONE) cream, Apply 1 application topically 2 (two) times daily., Disp: 30 g, Rfl: 3   furosemide (LASIX) 20 MG tablet, Take 1 tablet (20 mg total) by mouth  daily., Disp: 90 tablet, Rfl: 1   metoprolol succinate (TOPROL-XL) 25 MG 24 hr tablet, Take 1 tablet (25 mg total) by mouth daily., Disp: 90 tablet, Rfl: 1   Multiple Vitamin (MULTIVITAMIN) tablet, Take 1 tablet by mouth daily., Disp: , Rfl:    olmesartan (BENICAR) 40 MG tablet, Take 1 tablet (40 mg total) by mouth daily., Disp: 30 tablet, Rfl: 2   sildenafil (VIAGRA) 100 MG tablet, Take 1 tablet (100 mg total) by mouth daily as needed for erectile dysfunction., Disp: 30 tablet, Rfl: 2   testosterone cypionate (DEPOTESTOSTERONE CYPIONATE) 200 MG/ML injection, Inject 0.5 mLs (100 mg total) into the muscle every 7 (seven) days., Disp: 10 mL, Rfl: 1   Vitamin D, Cholecalciferol, 10 MCG (400 UNIT) CAPS, Take 1 capsule by mouth daily., Disp: , Rfl:   No Known Allergies  Past Medical History:  Diagnosis Date   Arthritis    Dermatitis    head  and face    Palpitations    Sleep apnea    cpap    Testosterone deficiency     Past Surgical History:  Procedure Laterality Date   ANKLE RECONSTRUCTION Right    SPINE SURGERY     cervical - 3 disc fusions    Family History  Problem Relation Age of Onset   Alcohol abuse Mother    Hypertension Mother    Heart attack Mother    Alcohol abuse Father    Hypertension Father    Heart attack Father    Seizures Brother    AAA (abdominal aortic aneurysm) Brother    Drug abuse Brother     Social History   Socioeconomic History   Marital status: Divorced    Spouse name: Not on file   Number of children: 3   Years of education: Not on file   Highest education level: Not on file  Occupational History   Not on file  Tobacco Use   Smoking status: Former   Smokeless tobacco: Current    Types: Nurse, children's Use: Never used  Substance and Sexual Activity   Alcohol use: Never   Drug use: Never   Sexual activity: Not on file  Other Topics Concern   Not on file  Social History Narrative   Not on file   Social Determinants of  Health   Financial Resource Strain: Not on file  Food Insecurity: Not on file  Transportation Needs: Not on file  Physical Activity: Not on file  Stress: Not on file  Social Connections: Not on file  Intimate Partner Violence: Not on file      Objective:    BP 136/90   Pulse 88   Temp 98.4 F (36.9 C) (Temporal)   Ht 6' 1"  (1.854 m)   Wt (!) 382 lb 12.8 oz (173.6 kg)   SpO2 96%   BMI 50.50 kg/m   Wt Readings from Last 3 Encounters:  07/11/21 (!) 382 lb 12.8 oz (173.6 kg)  06/12/21 (!) 376 lb 8 oz (170.8 kg)  04/07/21 (!) 376 lb 3.2 oz (170.6 kg)    Physical Exam Vitals reviewed.  Constitutional:      General: He is not in acute distress.    Appearance: Normal appearance. He is morbidly obese. He is not ill-appearing, toxic-appearing or diaphoretic.  HENT:     Head: Normocephalic and atraumatic.     Right Ear: Tympanic membrane, ear canal and external ear normal. There is no impacted cerumen.     Left Ear: Tympanic membrane, ear canal and external ear normal. There is no impacted cerumen.     Nose: Nose normal. No congestion or rhinorrhea.     Mouth/Throat:     Mouth: Mucous membranes are moist.     Pharynx: Oropharynx is clear. No oropharyngeal exudate or posterior oropharyngeal erythema.  Eyes:     General: No scleral icterus.       Right eye: No discharge.        Left eye: No discharge.     Conjunctiva/sclera: Conjunctivae normal.     Pupils: Pupils are equal, round, and reactive to light.  Neck:     Vascular: No carotid bruit.  Cardiovascular:     Rate and Rhythm: Normal rate and regular rhythm.     Heart sounds: Normal heart sounds. No murmur heard.   No friction rub. No gallop.  Pulmonary:     Effort: Pulmonary effort is normal. No respiratory distress.     Breath sounds: Normal breath sounds. No stridor. No wheezing, rhonchi or rales.  Abdominal:     General: Abdomen is flat. Bowel sounds are normal. There is no distension.     Palpations: Abdomen is  soft. There is no mass.     Tenderness: There is no abdominal tenderness. There is no guarding or rebound.     Hernia: No hernia is present.  Musculoskeletal:        General: Normal range of motion.     Cervical back: Normal range of motion and neck supple. No rigidity. No muscular tenderness.     Right lower leg: No edema.     Left lower leg: No edema.  Lymphadenopathy:     Cervical: No cervical adenopathy.  Skin:    General: Skin is warm and dry.     Capillary Refill: Capillary refill takes less than 2 seconds.  Neurological:     General: No focal deficit present.     Mental Status: He is alert and oriented to person, place, and time. Mental status is at baseline.  Psychiatric:        Mood and Affect: Mood normal.        Behavior: Behavior normal.        Thought Content: Thought content normal.        Judgment: Judgment normal.    Lab Results  Component Value Date   TSH 2.240 06/24/2020   Lab Results  Component Value Date   WBC 11.5 (H) 01/26/2021   HGB 14.9 01/26/2021   HCT 45.4 01/26/2021   MCV 88.0 01/26/2021   PLT 223 01/26/2021   Lab Results  Component Value Date   NA 134 (L) 01/26/2021   K 3.9 01/26/2021   CO2 25 01/26/2021   GLUCOSE 112 (H) 01/26/2021   BUN 17 01/26/2021   CREATININE 0.91 01/26/2021   BILITOT <0.2 06/24/2020   ALKPHOS 70 06/24/2020   AST 18 06/24/2020   ALT 24 06/24/2020   PROT 7.0 06/24/2020   ALBUMIN 4.3 06/24/2020   CALCIUM 8.5 (L) 01/26/2021   ANIONGAP 7 01/26/2021   Lab Results  Component Value Date   CHOL  150 06/24/2020   Lab Results  Component Value Date   HDL 33 (L) 06/24/2020   Lab Results  Component Value Date   LDLCALC 94 06/24/2020   Lab Results  Component Value Date   TRIG 128 06/24/2020   Lab Results  Component Value Date   CHOLHDL 4.5 06/24/2020   No results found for: HGBA1C

## 2021-07-12 LAB — CBC WITH DIFFERENTIAL/PLATELET
Basophils Absolute: 0 10*3/uL (ref 0.0–0.2)
Basos: 1 %
EOS (ABSOLUTE): 0.2 10*3/uL (ref 0.0–0.4)
Eos: 2 %
Hematocrit: 45.6 % (ref 37.5–51.0)
Hemoglobin: 15.7 g/dL (ref 13.0–17.7)
Immature Grans (Abs): 0.1 10*3/uL (ref 0.0–0.1)
Immature Granulocytes: 1 %
Lymphocytes Absolute: 2 10*3/uL (ref 0.7–3.1)
Lymphs: 26 %
MCH: 28.9 pg (ref 26.6–33.0)
MCHC: 34.4 g/dL (ref 31.5–35.7)
MCV: 84 fL (ref 79–97)
Monocytes Absolute: 0.7 10*3/uL (ref 0.1–0.9)
Monocytes: 9 %
Neutrophils Absolute: 4.8 10*3/uL (ref 1.4–7.0)
Neutrophils: 61 %
Platelets: 251 10*3/uL (ref 150–450)
RBC: 5.43 x10E6/uL (ref 4.14–5.80)
RDW: 13.9 % (ref 11.6–15.4)
WBC: 7.9 10*3/uL (ref 3.4–10.8)

## 2021-07-12 LAB — CMP14+EGFR
ALT: 21 IU/L (ref 0–44)
AST: 15 IU/L (ref 0–40)
Albumin/Globulin Ratio: 1.5 (ref 1.2–2.2)
Albumin: 4.3 g/dL (ref 3.8–4.9)
Alkaline Phosphatase: 73 IU/L (ref 44–121)
BUN/Creatinine Ratio: 16 (ref 9–20)
BUN: 17 mg/dL (ref 6–24)
Bilirubin Total: 0.3 mg/dL (ref 0.0–1.2)
CO2: 23 mmol/L (ref 20–29)
Calcium: 9 mg/dL (ref 8.7–10.2)
Chloride: 102 mmol/L (ref 96–106)
Creatinine, Ser: 1.08 mg/dL (ref 0.76–1.27)
Globulin, Total: 2.9 g/dL (ref 1.5–4.5)
Glucose: 98 mg/dL (ref 70–99)
Potassium: 4 mmol/L (ref 3.5–5.2)
Sodium: 138 mmol/L (ref 134–144)
Total Protein: 7.2 g/dL (ref 6.0–8.5)
eGFR: 83 mL/min/{1.73_m2} (ref >59)

## 2021-07-12 LAB — LIPID PANEL
Chol/HDL Ratio: 4.4 ratio (ref 0.0–5.0)
Cholesterol, Total: 149 mg/dL (ref 100–199)
HDL: 34 mg/dL — ABNORMAL LOW (ref >39)
LDL Chol Calc (NIH): 87 mg/dL (ref 0–99)
Triglycerides: 161 mg/dL — ABNORMAL HIGH (ref 0–149)
VLDL Cholesterol Cal: 28 mg/dL (ref 5–40)

## 2021-07-14 ENCOUNTER — Encounter: Payer: Self-pay | Admitting: Family Medicine

## 2021-07-14 DIAGNOSIS — N529 Male erectile dysfunction, unspecified: Secondary | ICD-10-CM | POA: Insufficient documentation

## 2021-07-17 ENCOUNTER — Other Ambulatory Visit: Payer: Self-pay | Admitting: Family Medicine

## 2021-07-17 DIAGNOSIS — I10 Essential (primary) hypertension: Secondary | ICD-10-CM

## 2021-09-11 ENCOUNTER — Encounter: Payer: Self-pay | Admitting: Urology

## 2021-09-11 ENCOUNTER — Ambulatory Visit (INDEPENDENT_AMBULATORY_CARE_PROVIDER_SITE_OTHER): Payer: 59 | Admitting: Urology

## 2021-09-11 ENCOUNTER — Other Ambulatory Visit: Payer: Self-pay

## 2021-09-11 VITALS — BP 178/80 | HR 92 | Wt 382.0 lb

## 2021-09-11 DIAGNOSIS — E291 Testicular hypofunction: Secondary | ICD-10-CM | POA: Diagnosis not present

## 2021-09-11 MED ORDER — TESTOSTERONE CYPIONATE 200 MG/ML IM SOLN: 100.0000 mg | INTRAMUSCULAR | 1 refills | Status: DC

## 2021-09-11 NOTE — Progress Notes (Signed)

## 2021-09-11 NOTE — Progress Notes (Signed)
09/11/2021 11:48 AM   Francisco Adams 11-02-1970 [id number]  Referring provider: Gwenlyn Fudge, FNP [address] [place],  [place] [address]  Cc: Low T   HPI:  1) low testosterone-patient had a testosterone in September 2020 of 193, November 2021 of 358, and October 2021 367 - he is on T replacement x 10 yrs. He tried gel and now on injections. He does (200 mg/ml) 0.5 ml q 2 weeks. Just before an injection his T was 172 Sep 2022. He notices the swings and wants to level the cycles and increase the T level. He feels well for a few days after each injection. PSA was 0.5.  Hematocrit 45. He has three kids and has a vasectomy.   We increased to 0.5 ml q weekly Sep 2022. His Oct 2022 Hct was 45. Due for PSA and T level.His last injection was Saturday.     2) ED - He has erectile dysfunction and takes sildenafil.  He has also had fatigue and weight gain.  He does not smoke. Sildenafil works better the few days p an injection.    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    PMH: Past Medical History:  Diagnosis Date   Arthritis    Dermatitis    head  and face    Palpitations    Sleep apnea    cpap    Testosterone deficiency     Surgical History: Past Surgical History:  Procedure Laterality Date   ANKLE RECONSTRUCTION Right    SPINE SURGERY     cervical - 3 disc fusions    Home Medications:  Allergies as of 09/11/2021   No Known Allergies      Medication List        Accurate as of September 11, 2021 11:48 AM. If you have any questions, ask your nurse or doctor.          clotrimazole-betamethasone cream Commonly known as: LOTRISONE Apply 1 application topically 2 (two) times daily.   furosemide 20 MG tablet Commonly known as: Lasix Take 1 tablet (20 mg total) by mouth daily.   metoprolol succinate 25 MG 24 hr tablet Commonly known as: TOPROL-XL Take 1 tablet (25 mg total) by mouth daily.   multivitamin tablet Take 1  tablet by mouth daily.   olmesartan 40 MG tablet Commonly known as: BENICAR Take 1 tablet by mouth once daily   sildenafil 100 MG tablet Commonly known as: VIAGRA Take 1 tablet (100 mg total) by mouth daily as needed for erectile dysfunction.   testosterone cypionate 200 MG/ML injection Commonly known as: DEPOTESTOSTERONE CYPIONATE Inject 0.5 mLs (100 mg total) into the muscle every 7 (seven) days.   Vitamin D (Cholecalciferol) 10 MCG (400 UNIT) Caps Take 1 capsule by mouth daily.        Allergies: No Known Allergies  Family History: Family History  Problem Relation Age of Onset   Alcohol abuse Mother    Hypertension Mother    Heart attack Mother    Alcohol abuse Father    Hypertension Father    Heart attack Father    Seizures Brother    AAA (abdominal aortic aneurysm) Brother    Drug abuse Brother     Social History:  reports that he has quit smoking. His smokeless tobacco use includes chew. He reports that he does not drink alcohol and does not use drugs.   Physical Exam: BP (!) 178/80  Pulse 92    Wt (!) 382 lb (173.3 kg)    BMI 50.40 kg/m   Constitutional:  Alert and oriented, No acute distress. HEENT: [place] AT, moist mucus membranes.  Trachea midline, no masses. Cardiovascular: No clubbing, cyanosis, or edema. Respiratory: Normal respiratory effort, no increased work of breathing. GI: Abdomen is soft, nontender, nondistended, no abdominal masses Skin: No rashes, bruises or suspicious lesions. Neurologic: Grossly intact, no focal deficits, moving all 4 extremities. Psychiatric: Normal mood and affect.  Laboratory Data: Lab Results  Component Value Date   WBC 7.9 07/11/2021   HGB 15.7 07/11/2021   HCT 45.6 07/11/2021   MCV 84 07/11/2021   PLT 251 07/11/2021    Lab Results  Component Value Date   CREATININE 1.08 07/11/2021    No results found for: PSA  Lab Results  Component Value Date   TESTOSTERONE 172 (L) 06/12/2021    No results found  for: HGBA1C  Urinalysis    Component Value Date/Time   COLORURINE YELLOW 10/21/2009 0553   APPEARANCEUR Clear 06/12/2021 0928   LABSPEC 1.024 10/21/2009 0553   PHURINE 5.0 10/21/2009 0553   GLUCOSEU Negative 06/12/2021 0928   HGBUR TRACE (A) 10/21/2009 0553   BILIRUBINUR Negative 06/12/2021 0928   KETONESUR NEGATIVE 10/21/2009 0553   PROTEINUR Negative 06/12/2021 0928   PROTEINUR NEGATIVE 10/21/2009 0553   UROBILINOGEN 0.2 10/21/2009 0553   NITRITE Negative 06/12/2021 0928   NITRITE NEGATIVE 10/21/2009 0553   LEUKOCYTESUR Negative 06/12/2021 0928    Lab Results  Component Value Date   LABMICR See below: 06/12/2021   WBCUA None seen 06/12/2021   LABEPIT None seen 06/12/2021   BACTERIA None seen 06/12/2021    Pertinent Imaging: N/a   Assessment & Plan:    1. Hypogonadism in male Check early/mid cycle T and PSA today. May need a trough. PSA was sent. T refilled.   - Urinalysis, Routine w reflex microscopic   No follow-ups on file.  Festus Aloe, MD  [facility]  [address] [place], [place] [address] [phone]

## 2021-09-12 LAB — TESTOSTERONE: Testosterone: 504 ng/dL (ref 264–916)

## 2021-09-12 LAB — PSA: Prostate Specific Ag, Serum: 0.6 ng/mL (ref 0.0–4.0)

## 2021-10-01 ENCOUNTER — Other Ambulatory Visit: Payer: Self-pay | Admitting: Family Medicine

## 2021-10-01 DIAGNOSIS — R600 Localized edema: Secondary | ICD-10-CM

## 2021-10-01 DIAGNOSIS — I10 Essential (primary) hypertension: Secondary | ICD-10-CM

## 2021-10-20 ENCOUNTER — Ambulatory Visit (INDEPENDENT_AMBULATORY_CARE_PROVIDER_SITE_OTHER): Payer: 59 | Admitting: Family Medicine

## 2021-10-20 ENCOUNTER — Encounter: Payer: Self-pay | Admitting: Family Medicine

## 2021-10-20 VITALS — BP 137/78 | HR 80 | Temp 98.2°F | Ht 73.0 in | Wt 391.4 lb

## 2021-10-20 DIAGNOSIS — L719 Rosacea, unspecified: Secondary | ICD-10-CM

## 2021-10-20 DIAGNOSIS — G4730 Sleep apnea, unspecified: Secondary | ICD-10-CM

## 2021-10-20 MED ORDER — DOXYCYCLINE HYCLATE 20 MG PO TABS: 20.0000 mg | ORAL_TABLET | Freq: Two times a day (BID) | ORAL | 2 refills | Status: DC

## 2021-10-20 NOTE — Progress Notes (Signed)
Assessment & Plan:  1. Sleep apnea treated with nocturnal BiPAP BiPAP supplies including new mask and tubing order placed to be faxed to [facility]. I have requested a copy of his sleep study from [facility] which used to be [place]. Discussed the possibility that insurance will not pay for supplies without an updated sleep study since it has been at least 15 years. He is agreeable to a home sleep study should this be the case.  - For home use only DME Other see comment  2. Rosacea Started patient on Doxycycline.  - doxycycline (PERIOSTAT) 20 MG tablet; Take 1 tablet (20 mg total) by mouth 2 (two) times daily.  Dispense: 60 tablet; Refill: 2   Return as scheduled.  [name], MSN, APRN, FNP-C [facility]  Subjective:    Patient ID: Francisco Adams, male    DOB: [birth year], 51 y.o.   MRN: [id number]  Patient Care Team: Loman Brooklyn, FNP as PCP - General (Family Medicine)   Chief Complaint:  Chief Complaint  Patient presents with   cpap supplies     HPI: Francisco Adams is a 51 y.o. male presenting on 10/20/2021 for cpap supplies   Patient is requesting an order for supplies to treat his sleep apnea. He normally gets these from [facility] and states this is the first time they requested an order from his PCP. His last sleep study was completed at [facility] at least 15 years ago. He has been wearing the same BiPAP since that time, which he does wear nightly.   New complaints: He is also concerned about his rosacea. He states if he missed his cream at all his face breaks out terribly and turns really red.    Social history:  Relevant past medical, surgical, family and social history reviewed and updated as indicated. Interim medical history since our last visit reviewed.  Allergies and medications reviewed and updated.  DATA REVIEWED: CHART IN EPIC  ROS: Negative unless specifically indicated above in HPI.    Current Outpatient  Medications:    clotrimazole-betamethasone (LOTRISONE) cream, Apply 1 application topically 2 (two) times daily., Disp: 30 g, Rfl: 3   furosemide (LASIX) 20 MG tablet, Take 1 tablet by mouth once daily, Disp: 90 tablet, Rfl: 0   metoprolol succinate (TOPROL-XL) 25 MG 24 hr tablet, Take 1 tablet by mouth once daily, Disp: 90 tablet, Rfl: 0   Multiple Vitamin (MULTIVITAMIN) tablet, Take 1 tablet by mouth daily., Disp: , Rfl:    olmesartan (BENICAR) 40 MG tablet, Take 1 tablet by mouth once daily, Disp: 90 tablet, Rfl: 1   sildenafil (VIAGRA) 100 MG tablet, Take 1 tablet (100 mg total) by mouth daily as needed for erectile dysfunction., Disp: 30 tablet, Rfl: 2   testosterone cypionate (DEPOTESTOSTERONE CYPIONATE) 200 MG/ML injection, Inject 0.5 mLs (100 mg total) into the muscle every 7 (seven) days., Disp: 10 mL, Rfl: 1   Vitamin D, Cholecalciferol, 10 MCG (400 UNIT) CAPS, Take 1 capsule by mouth daily., Disp: , Rfl:    No Known Allergies Past Medical History:  Diagnosis Date   Arthritis    Dermatitis    head  and face    Palpitations    Sleep apnea    cpap    Testosterone deficiency     Past Surgical History:  Procedure Laterality Date   ANKLE RECONSTRUCTION Right    SPINE SURGERY     cervical - 3 disc fusions    Social History  Socioeconomic History   Marital status: Divorced    Spouse name: Not on file   Number of children: 3   Years of education: Not on file   Highest education level: Not on file  Occupational History   Not on file  Tobacco Use   Smoking status: Former   Smokeless tobacco: Current    Types: Chew  Vaping Use   Vaping Use: Never used  Substance and Sexual Activity   Alcohol use: Never   Drug use: Never   Sexual activity: Not on file  Other Topics Concern   Not on file  Social History Narrative   Not on file   Social Determinants of Health   Financial Resource Strain: Not on file  Food Insecurity: Not on file  Transportation Needs: Not on file   Physical Activity: Not on file  Stress: Not on file  Social Connections: Not on file  Intimate Partner Violence: Not on file        Objective:    BP 137/78    Pulse 80    Temp 98.2 F (36.8 C) (Temporal)    Ht 6' 1"  (1.854 m)    Wt (!) 391 lb 6.4 oz (177.5 kg)    SpO2 94%    BMI 51.64 kg/m   Wt Readings from Last 3 Encounters:  10/20/21 (!) 391 lb 6.4 oz (177.5 kg)  09/11/21 (!) 382 lb (173.3 kg)  07/11/21 (!) 382 lb 12.8 oz (173.6 kg)    Physical Exam Vitals reviewed.  Constitutional:      General: He is not in acute distress.    Appearance: Normal appearance. He is morbidly obese. He is not ill-appearing, toxic-appearing or diaphoretic.  HENT:     Head: Normocephalic and atraumatic.  Eyes:     General: No scleral icterus.       Right eye: No discharge.        Left eye: No discharge.     Conjunctiva/sclera: Conjunctivae normal.  Cardiovascular:     Rate and Rhythm: Normal rate and regular rhythm.     Heart sounds: Normal heart sounds. No murmur heard.   No friction rub. No gallop.  Pulmonary:     Effort: Pulmonary effort is normal. No respiratory distress.     Breath sounds: Normal breath sounds. No stridor. No wheezing, rhonchi or rales.  Musculoskeletal:        General: Normal range of motion.     Cervical back: Normal range of motion.  Skin:    General: Skin is warm and dry.     Findings: Erythema (face) present.  Neurological:     Mental Status: He is alert and oriented to person, place, and time. Mental status is at baseline.  Psychiatric:        Mood and Affect: Mood normal.        Behavior: Behavior normal.        Thought Content: Thought content normal.        Judgment: Judgment normal.    Lab Results  Component Value Date   TSH 2.240 06/24/2020   Lab Results  Component Value Date   WBC 7.9 07/11/2021   HGB 15.7 07/11/2021   HCT 45.6 07/11/2021   MCV 84 07/11/2021   PLT 251 07/11/2021   Lab Results  Component Value Date   NA 138 07/11/2021    K 4.0 07/11/2021   CO2 23 07/11/2021   GLUCOSE 98 07/11/2021   BUN 17 07/11/2021   CREATININE 1.08 07/11/2021  BILITOT 0.3 07/11/2021   ALKPHOS 73 07/11/2021   AST 15 07/11/2021   ALT 21 07/11/2021   PROT 7.2 07/11/2021   ALBUMIN 4.3 07/11/2021   CALCIUM 9.0 07/11/2021   ANIONGAP 7 01/26/2021   EGFR 83 07/11/2021   Lab Results  Component Value Date   CHOL 149 07/11/2021   Lab Results  Component Value Date   HDL 34 (L) 07/11/2021   Lab Results  Component Value Date   LDLCALC 87 07/11/2021   Lab Results  Component Value Date   TRIG 161 (H) 07/11/2021   Lab Results  Component Value Date   CHOLHDL 4.4 07/11/2021   No results found for: HGBA1C

## 2021-10-23 ENCOUNTER — Encounter: Payer: Self-pay | Admitting: Family Medicine

## 2021-10-23 DIAGNOSIS — G4730 Sleep apnea, unspecified: Secondary | ICD-10-CM | POA: Insufficient documentation

## 2021-11-07 ENCOUNTER — Encounter: Payer: Self-pay | Admitting: Nurse Practitioner

## 2021-11-07 ENCOUNTER — Ambulatory Visit: Payer: 59 | Admitting: Family Medicine

## 2021-11-07 ENCOUNTER — Ambulatory Visit (INDEPENDENT_AMBULATORY_CARE_PROVIDER_SITE_OTHER): Payer: 59 | Admitting: Nurse Practitioner

## 2021-11-07 VITALS — BP 138/86 | HR 91 | Temp 98.2°F | Resp 20 | Ht 73.0 in | Wt 387.0 lb

## 2021-11-07 DIAGNOSIS — J40 Bronchitis, not specified as acute or chronic: Secondary | ICD-10-CM

## 2021-11-07 MED ORDER — METHYLPREDNISOLONE ACETATE 40 MG/ML IJ SUSP
40.0000 mg | Freq: Once | INTRAMUSCULAR | Status: AC
  Administered 2021-11-07: 40 mg via INTRAMUSCULAR

## 2021-11-07 MED ORDER — HYDROCODONE BIT-HOMATROP MBR 5-1.5 MG/5ML PO SOLN
5.0000 mL | Freq: Four times a day (QID) | ORAL | 0 refills | Status: DC | PRN
Start: 2021-11-07 — End: 2022-01-12

## 2021-11-07 MED ORDER — ALBUTEROL SULFATE HFA 108 (90 BASE) MCG/ACT IN AERS
2.0000 | INHALATION_SPRAY | Freq: Four times a day (QID) | RESPIRATORY_TRACT | 2 refills | Status: DC | PRN
Start: 2021-11-07 — End: 2022-01-24

## 2021-11-07 MED ORDER — PREDNISONE 20 MG PO TABS: 40.0000 mg | ORAL_TABLET | Freq: Every day | ORAL | 0 refills | Status: AC

## 2021-11-07 MED ORDER — AZITHROMYCIN 250 MG PO TABS: ORAL_TABLET | ORAL | 0 refills | Status: DC

## 2021-11-07 NOTE — Progress Notes (Signed)
° °  Subjective:    Patient ID: Francisco Adams, male    DOB: Aug 12, 1971, 51 y.o.   MRN: [id number]   Chief Complaint: sinusitis  Sinusitis Associated symptoms include chills, congestion, coughing and shortness of breath. Pertinent negatives include no headaches, sinus pressure or sore throat.  Patient has been sick since last Thursday. He went to urgent care. Flu and covid were negative. They prescribed him tessalon perles and antibiotic. The pharmacy gave him his tessalon perles but the antibiotic was on back order, so he still has not gotten antibiotic.  ( Unable to see urgent care notes, so not sure what he was prescribed.) He say he is no better. He still has chronic cough and congestion. Fever of 100.5. feels like he cannot get any air.    Review of Systems  Constitutional:  Positive for chills, fatigue and fever.  HENT:  Positive for congestion, postnasal drip and rhinorrhea. Negative for sinus pressure, sinus pain and sore throat.   Respiratory:  Positive for cough, shortness of breath and wheezing.   Musculoskeletal:  Negative for myalgias.  Neurological:  Negative for headaches.      Objective:   Physical Exam Vitals reviewed.  Constitutional:      Appearance: Normal appearance. He is obese.  HENT:     Nose: Congestion and rhinorrhea present.     Mouth/Throat:     Pharynx: No oropharyngeal exudate or posterior oropharyngeal erythema.  Cardiovascular:     Rate and Rhythm: Regular rhythm.     Heart sounds: Normal heart sounds.  Pulmonary:     Breath sounds: Wheezing (exp wheezes in upper lobes) present.  Musculoskeletal:     Cervical back: Normal range of motion.  Skin:    General: Skin is warm.  Neurological:     General: No focal deficit present.     Mental Status: He is alert.    BP 138/86    Pulse 91    Temp 98.2 F (36.8 C) (Temporal)    Resp 20    Ht 6\' 1"  (1.854 m)    Wt (!) 387 lb (175.5 kg)    SpO2 93%    BMI 51.06 kg/m        Assessment & Plan:   in today with chief complaint of Sinusitis   1. Bronchitis Force fluids  Rest - methylPREDNISolone acetate (DEPO-MEDROL) injection 40 mg - azithromycin (ZITHROMAX Z-PAK) 250 MG tablet; As directed  Dispense: 6 tablet; Refill: 0 - HYDROcodone bit-homatropine (HYCODAN) 5-1.5 MG/5ML syrup; Take 5 mLs by mouth every 6 (six) hours as needed for cough.  Dispense: 120 mL; Refill: 0 - predniSONE (DELTASONE) 20 MG tablet; Take 2 tablets (40 mg total) by mouth daily with breakfast for 5 days. 2 po daily for 5 days  Dispense: 10 tablet; Refill: 0 - albuterol (VENTOLIN HFA) 108 (90 Base) MCG/ACT inhaler; Inhale 2 puffs into the lungs every 6 (six) hours as needed for wheezing or shortness of breath.  Dispense: 8 g; Refill: 2    The above assessment and management plan was discussed with the patient. The patient verbalized understanding of and has agreed to the management plan. Patient is aware to call the clinic if symptoms persist or worsen. Patient is aware when to return to the clinic for a follow-up visit. Patient educated on when it is appropriate to go to the emergency department.   Mary-Margaret [name], FNP

## 2021-11-07 NOTE — Patient Instructions (Signed)

## 2021-12-27 ENCOUNTER — Other Ambulatory Visit: Payer: Self-pay | Admitting: Family Medicine

## 2021-12-27 DIAGNOSIS — I10 Essential (primary) hypertension: Secondary | ICD-10-CM

## 2021-12-27 DIAGNOSIS — R600 Localized edema: Secondary | ICD-10-CM

## 2022-01-12 ENCOUNTER — Telehealth: Payer: Self-pay | Admitting: *Deleted

## 2022-01-12 ENCOUNTER — Ambulatory Visit (INDEPENDENT_AMBULATORY_CARE_PROVIDER_SITE_OTHER): Payer: 59 | Admitting: Family Medicine

## 2022-01-12 ENCOUNTER — Encounter: Payer: Self-pay | Admitting: Family Medicine

## 2022-01-12 ENCOUNTER — Telehealth: Payer: Self-pay | Admitting: Family Medicine

## 2022-01-12 VITALS — BP 140/90 | HR 87 | Temp 98.2°F | Resp 20 | Ht 73.0 in | Wt 385.0 lb

## 2022-01-12 DIAGNOSIS — E781 Pure hyperglyceridemia: Secondary | ICD-10-CM | POA: Diagnosis not present

## 2022-01-12 DIAGNOSIS — E291 Testicular hypofunction: Secondary | ICD-10-CM | POA: Diagnosis not present

## 2022-01-12 DIAGNOSIS — G4730 Sleep apnea, unspecified: Secondary | ICD-10-CM

## 2022-01-12 DIAGNOSIS — N529 Male erectile dysfunction, unspecified: Secondary | ICD-10-CM

## 2022-01-12 DIAGNOSIS — Z0000 Encounter for general adult medical examination without abnormal findings: Secondary | ICD-10-CM

## 2022-01-12 DIAGNOSIS — R600 Localized edema: Secondary | ICD-10-CM

## 2022-01-12 DIAGNOSIS — I10 Essential (primary) hypertension: Secondary | ICD-10-CM | POA: Diagnosis not present

## 2022-01-12 DIAGNOSIS — L719 Rosacea, unspecified: Secondary | ICD-10-CM

## 2022-01-12 DIAGNOSIS — F5101 Primary insomnia: Secondary | ICD-10-CM

## 2022-01-12 MED ORDER — SEMAGLUTIDE-WEIGHT MANAGEMENT 1.7 MG/0.75ML ~~LOC~~ SOAJ: 1.7000 mg | SUBCUTANEOUS | 0 refills | Status: AC

## 2022-01-12 MED ORDER — SEMAGLUTIDE-WEIGHT MANAGEMENT 0.25 MG/0.5ML ~~LOC~~ SOAJ: 0.2500 mg | SUBCUTANEOUS | 0 refills | Status: AC

## 2022-01-12 MED ORDER — SEMAGLUTIDE-WEIGHT MANAGEMENT 0.5 MG/0.5ML ~~LOC~~ SOAJ: 0.5000 mg | SUBCUTANEOUS | 0 refills | Status: AC

## 2022-01-12 MED ORDER — HYDROCHLOROTHIAZIDE 12.5 MG PO TABS: 12.5000 mg | ORAL_TABLET | Freq: Every day | ORAL | 2 refills | Status: DC

## 2022-01-12 MED ORDER — SEMAGLUTIDE-WEIGHT MANAGEMENT 2.4 MG/0.75ML ~~LOC~~ SOAJ: 2.4000 mg | SUBCUTANEOUS | 0 refills | Status: AC

## 2022-01-12 MED ORDER — SEMAGLUTIDE-WEIGHT MANAGEMENT 1 MG/0.5ML ~~LOC~~ SOAJ: 1.0000 mg | SUBCUTANEOUS | 0 refills | Status: AC

## 2022-01-12 MED ORDER — ZOLPIDEM TARTRATE 5 MG PO TABS: 5.0000 mg | ORAL_TABLET | Freq: Every day | ORAL | 1 refills | Status: DC

## 2022-01-12 NOTE — Addendum Note (Signed)
Addended by: Deliah Boston F on: 01/12/2022 12:09 PM ? ? Modules accepted: Orders ? ?

## 2022-01-12 NOTE — Telephone Encounter (Signed)
PA for [facility] 0.25mg -APPROVED ? ? ?CaseId:[id number];Status:Approved;Review Type:Prior Auth;Coverage Start Date:12/13/2021;Coverage End Date:08/10/2022; ? ?Pharmacy notified ?

## 2022-01-12 NOTE — Telephone Encounter (Signed)
Patient calling to let Francisco Adams know that his insurance does cover [id number] ?

## 2022-01-12 NOTE — Telephone Encounter (Signed)
Pt aware of process and that it was sent  ?

## 2022-01-12 NOTE — Telephone Encounter (Signed)
Awesome. Please let patient know I have prescribed it. He will likely still need a PA, please explain this to him. If the pharmacy does not show him how to use it, he can bring it here for a nurse to teach him.  ?

## 2022-01-12 NOTE — Patient Instructions (Addendum)
Please complete your Cologuard by next month. ? ? ? ?Here is a guide to help [place] find out which weight loss medications will be covered by your insurance plan.  ?Please check out this web site  [email or web address] and follow the 3 simple steps.  ? ?There is also a phone number you can call if you do not have access to the Internet. [phone] (Monday- Friday 8am-8pm) ? ?[employer] provides coverage information for more than 80% of the inquiries submitted!! ? ?

## 2022-01-12 NOTE — Progress Notes (Addendum)
? ?Assessment & Plan:  ?1. Essential hypertension ?Uncontrolled. D/C'd furosemide since he is not taking it consistently. Rx'd HCTZ. Encouraged healthy eating and exercise. ?- hydrochlorothiazide (HYDRODIURIL) 12.5 MG tablet; Take 1 tablet (12.5 mg total) by mouth daily.  Dispense: 30 tablet; Refill: 2 ? ?2. Localized edema ?Uncontrolled. D/C'd furosemide since he is not taking it consistently. Rx'd HCTZ.  ? ?3. Hypertriglyceridemia ?Encouraged healthy eating and exercise. ? ?4-5. Hypogonadism in male/Primary erectile dysfunction ?Managed by urology. ? ?6. Sleep apnea treated with nocturnal BiPAP ?Continue wearing BiPAP nightly. ? ?7. Rosacea ?Well controlled on current regimen.  ? ?8. Morbid obesity (HCC) ?Patient is going to check at [email or web address] to see if his insurance will cover [facility] or [facility]. If yes, he will let me know and I will prescribe. Encouraged healthy eating and exercise.  ?Patient called back to let me know his insurance will cover [phone]; it has been prescribed. ? ?9. Primary insomnia ?Uncontrolled. Starting Ambien. ?- zolpidem (AMBIEN) 5 MG tablet; Take 1 tablet (5 mg total) by mouth at bedtime.  Dispense: 30 tablet; Refill: 1 ? ?10. Healthcare maintenance ?Encouraged to complete Cologuard which he still has at home.  ? ? ?Follow-up: Return in about 4 weeks (around 02/09/2022) for weight, sleep & HTN.  ? ?Deliah Boston, MSN, APRN, FNP-C ?[facility] ? ?Subjective:  ?Patient ID: Francisco Adams, male    DOB: [birth year]  Age: 51 y.o. MRN: [id number] ? ?Patient Care Team: ?Gwenlyn Fudge, FNP as PCP - General (Family Medicine)  ? ?CC:  ?Chief Complaint  ?Patient presents with  ? Medical Management of Chronic Issues  ? ? ?HPI ?Francisco Adams presents for Medical Management of Chronic Issues ? ?Low Testosterone/Hypogonadism ?Now managed by urology who he last saw in December 2022.  ? ?Sleep Apnea: wearing BiPAP nightly.  ? ?Rosacea: takes Doxycycline daily.  ? ?Erectile  Dysfunction ?Using sildenafil 100 mg as needed. ? ?Hypertension ?Taking metoprolol and olmesartan daily. He does occasionally check his blood pressure at home and reports similar readings to ours today. ? ?Edema ?Patient admits he only takes his furosemide probably twice monthly as he is unable to take it and go to work due to the amount it makes him urinate.  ? ?Hypertriglyceridemia ?Taking The 10-year ASCVD risk score (Arnett DK, et al., 2019) is: 6.3% ?  Values used to calculate the score: ?    Age: 78 years ?    Sex: Male ?    Is Non-Hispanic African American: No ?    Diabetic: No ?    Tobacco smoker: No ?    Systolic Blood Pressure: 140 mmHg ?    Is BP treated: Yes ?    HDL Cholesterol: 34 mg/dL ?    Total Cholesterol: 149 mg/dL ? ? ?New Complaints: ?Patient reports he wakes after 2 hours of sleep and cannot get back to sleep. It has been occurring for at least six months and seems to be getting worse. Previously took Ambien which was helpful when he took it. He stopped taking it as he does not like to take medications. ? ? ?ROS: Negative unless specifically indicated above in HPI.  ? ? ?Current Outpatient Medications:  ?  albuterol (VENTOLIN HFA) 108 (90 Base) MCG/ACT inhaler, Inhale 2 puffs into the lungs every 6 (six) hours as needed for wheezing or shortness of breath., Disp: 8 g, Rfl: 2 ?  clotrimazole-betamethasone (LOTRISONE) cream, Apply 1 application topically 2 (two) times daily., Disp: 30 g, Rfl: 3 ?  doxycycline (PERIOSTAT) 20 MG tablet, Take 1 tablet (20 mg total) by mouth 2 (two) times daily., Disp: 60 tablet, Rfl: 2 ?  furosemide (LASIX) 20 MG tablet, Take 1 tablet by mouth once daily, Disp: 90 tablet, Rfl: 0 ?  metoprolol succinate (TOPROL-XL) 25 MG 24 hr tablet, Take 1 tablet by mouth once daily, Disp: 90 tablet, Rfl: 0 ?  Multiple Vitamin (MULTIVITAMIN) tablet, Take 1 tablet by mouth daily., Disp: , Rfl:  ?  olmesartan (BENICAR) 40 MG tablet, Take 1 tablet by mouth once daily, Disp: 90 tablet,  Rfl: 0 ?  sildenafil (VIAGRA) 100 MG tablet, Take 1 tablet (100 mg total) by mouth daily as needed for erectile dysfunction., Disp: 30 tablet, Rfl: 2 ?  testosterone cypionate (DEPOTESTOSTERONE CYPIONATE) 200 MG/ML injection, Inject 0.5 mLs (100 mg total) into the muscle every 7 (seven) days., Disp: 10 mL, Rfl: 1 ?  Vitamin D, Cholecalciferol, 10 MCG (400 UNIT) CAPS, Take 1 capsule by mouth daily., Disp: , Rfl:  ? ?No Known Allergies ? ?Past Medical History:  ?Diagnosis Date  ? Arthritis   ? Dermatitis   ? head  and face   ? Palpitations   ? Sleep apnea   ? cpap   ? Testosterone deficiency   ? ? ?Past Surgical History:  ?Procedure Laterality Date  ? ANKLE RECONSTRUCTION Right   ? SPINE SURGERY    ? cervical - 3 disc fusions  ? ? ?Family History  ?Problem Relation Age of Onset  ? Alcohol abuse Mother   ? Hypertension Mother   ? Heart attack Mother   ? Alcohol abuse Father   ? Hypertension Father   ? Heart attack Father   ? Seizures Brother   ? AAA (abdominal aortic aneurysm) Brother   ? Drug abuse Brother   ? ? ?Social History  ? ?Socioeconomic History  ? Marital status: Divorced  ?  Spouse name: Not on file  ? Number of children: 3  ? Years of education: Not on file  ? Highest education level: Not on file  ?Occupational History  ? Not on file  ?Tobacco Use  ? Smoking status: Former  ? Smokeless tobacco: Current  ?  Types: Chew  ?Vaping Use  ? Vaping Use: Never used  ?Substance and Sexual Activity  ? Alcohol use: Never  ? Drug use: Never  ? Sexual activity: Not on file  ?Other Topics Concern  ? Not on file  ?Social History Narrative  ? Not on file  ? ?Social Determinants of Health  ? ?Financial Resource Strain: Not on file  ?Food Insecurity: Not on file  ?Transportation Needs: Not on file  ?Physical Activity: Not on file  ?Stress: Not on file  ?Social Connections: Not on file  ?Intimate Partner Violence: Not on file  ? ? ?  ?Objective:  ?  ?BP 140/90   Pulse 87   Temp 98.2 ?F (36.8 ?C)   Resp 20   Ht 6\' 1"  (1.854  m)   Wt (!) 385 lb (174.6 kg)   SpO2 94%   BMI 50.79 kg/m?  ? ?Wt Readings from Last 3 Encounters:  ?01/12/22 (!) 385 lb (174.6 kg)  ?11/07/21 (!) 387 lb (175.5 kg)  ?10/20/21 (!) 391 lb 6.4 oz (177.5 kg)  ? ? ?Physical Exam ?Vitals reviewed.  ?Constitutional:   ?   General: He is not in acute distress. ?   Appearance: Normal appearance. He is morbidly obese. He is not ill-appearing, toxic-appearing or diaphoretic.  ?HENT:  ?  Head: Normocephalic and atraumatic.  ?   Right Ear: Tympanic membrane, ear canal and external ear normal. There is no impacted cerumen.  ?   Left Ear: Tympanic membrane, ear canal and external ear normal. There is no impacted cerumen.  ?   Nose: Nose normal. No congestion or rhinorrhea.  ?   Mouth/Throat:  ?   Mouth: Mucous membranes are moist.  ?   Pharynx: Oropharynx is clear. No oropharyngeal exudate or posterior oropharyngeal erythema.  ?Eyes:  ?   General: No scleral icterus.    ?   Right eye: No discharge.     ?   Left eye: No discharge.  ?   Conjunctiva/sclera: Conjunctivae normal.  ?   Pupils: Pupils are equal, round, and reactive to light.  ?Neck:  ?   Vascular: No carotid bruit.  ?Cardiovascular:  ?   Rate and Rhythm: Normal rate and regular rhythm.  ?   Heart sounds: Normal heart sounds. No murmur heard. ?  No friction rub. No gallop.  ?Pulmonary:  ?   Effort: Pulmonary effort is normal. No respiratory distress.  ?   Breath sounds: Normal breath sounds. No stridor. No wheezing, rhonchi or rales.  ?Abdominal:  ?   General: Abdomen is flat. Bowel sounds are normal. There is no distension.  ?   Palpations: Abdomen is soft. There is no mass.  ?   Tenderness: There is no abdominal tenderness. There is no guarding or rebound.  ?   Hernia: No hernia is present.  ?Musculoskeletal:     ?   General: Normal range of motion.  ?   Cervical back: Normal range of motion and neck supple. No rigidity. No muscular tenderness.  ?   Right lower leg: No edema.  ?   Left lower leg: No edema.   ?Lymphadenopathy:  ?   Cervical: No cervical adenopathy.  ?Skin: ?   General: Skin is warm and dry.  ?   Capillary Refill: Capillary refill takes less than 2 seconds.  ?Neurological:  ?   General: No focal deficit presen

## 2022-01-24 ENCOUNTER — Other Ambulatory Visit: Payer: Self-pay | Admitting: Nurse Practitioner

## 2022-01-24 DIAGNOSIS — J40 Bronchitis, not specified as acute or chronic: Secondary | ICD-10-CM

## 2022-02-07 ENCOUNTER — Other Ambulatory Visit: Payer: Self-pay | Admitting: Family Medicine

## 2022-02-23 ENCOUNTER — Ambulatory Visit: Payer: 59 | Admitting: Family Medicine

## 2022-02-23 ENCOUNTER — Ambulatory Visit (INDEPENDENT_AMBULATORY_CARE_PROVIDER_SITE_OTHER): Payer: 59 | Admitting: Family Medicine

## 2022-02-23 ENCOUNTER — Telehealth: Payer: Self-pay | Admitting: Family Medicine

## 2022-02-23 ENCOUNTER — Encounter: Payer: Self-pay | Admitting: Family Medicine

## 2022-02-23 VITALS — BP 138/83 | HR 71 | Wt 363.2 lb

## 2022-02-23 DIAGNOSIS — R600 Localized edema: Secondary | ICD-10-CM

## 2022-02-23 DIAGNOSIS — I10 Essential (primary) hypertension: Secondary | ICD-10-CM

## 2022-02-23 DIAGNOSIS — F5101 Primary insomnia: Secondary | ICD-10-CM

## 2022-02-23 MED ORDER — ZOLPIDEM TARTRATE 10 MG PO TABS: 10.0000 mg | ORAL_TABLET | Freq: Every day | ORAL | 2 refills | Status: DC

## 2022-02-23 MED ORDER — HYDROCHLOROTHIAZIDE 12.5 MG PO TABS: 12.5000 mg | ORAL_TABLET | Freq: Two times a day (BID) | ORAL | 1 refills | Status: DC

## 2022-02-23 NOTE — Telephone Encounter (Signed)
Patient aware no samples  

## 2022-02-23 NOTE — Progress Notes (Signed)
Virtual Visit via Telephone Note  I connected with Francisco Adams on 02/23/22 at 1:04 PM by telephone and verified that I am speaking with the correct person using two identifiers. Francisco Adams is currently located in his vehicle and nobody is currently with him during this visit. The provider, Gwenlyn Fudge, FNP is located in their home at time of visit.  I discussed the limitations, risks, security and privacy concerns of performing an evaluation and management service by telephone and the availability of in person appointments. I also discussed with the patient that there may be a patient responsible charge related to this service. The patient expressed understanding and agreed to proceed.  Subjective: PCP: Gwenlyn Fudge, FNP  Chief Complaint  Patient presents with   Medical Management of Chronic Issues   Hypertension/edema: At our last visit patient was started on HCTZ.  He does occasionally check his blood pressure, but not often.  He did check it this morning.    Insomnia: Patient was started on Ambien approximately 6 weeks ago.  He reports today it is definitely helpful.  He is falling asleep a lot better.  But he is still waking up after 5 hours.  Obesity: Patient started on Wegovy 0.25 mg weekly approximately a month ago.  He states it definitely curbed his appetite and he would is not having any more cravings.  He is no longer drinking any soda.  He is only drinking water.  He is making healthy food choices.  He does a lot of walking at work averaging 12,000 steps per day.   ROS: Per HPI  Current Outpatient Medications:    albuterol (VENTOLIN HFA) 108 (90 Base) MCG/ACT inhaler, INHALE 2 PUFFS INTO THE LUNGS EVERY 6 HOURS AS NEEDED FOR WHEEZING OR SHORTNESS OF BREATH, Disp: 20.1 g, Rfl: 0   clotrimazole-betamethasone (LOTRISONE) cream, Apply 1 application topically 2 (two) times daily., Disp: 30 g, Rfl: 3   doxycycline (PERIOSTAT) 20 MG tablet, Take 1 tablet (20 mg total)  by mouth 2 (two) times daily., Disp: 60 tablet, Rfl: 2   hydrochlorothiazide (HYDRODIURIL) 12.5 MG tablet, Take 1 tablet (12.5 mg total) by mouth daily., Disp: 30 tablet, Rfl: 2   metoprolol succinate (TOPROL-XL) 25 MG 24 hr tablet, Take 1 tablet by mouth once daily, Disp: 90 tablet, Rfl: 0   Multiple Vitamin (MULTIVITAMIN) tablet, Take 1 tablet by mouth daily., Disp: , Rfl:    olmesartan (BENICAR) 40 MG tablet, Take 1 tablet by mouth once daily, Disp: 90 tablet, Rfl: 0   Semaglutide-Weight Management 0.5 MG/0.5ML SOAJ, Inject 0.5 mg into the skin once a week for 28 days., Disp: 2 mL, Rfl: 0   [START ON 03/11/2022] Semaglutide-Weight Management 1 MG/0.5ML SOAJ, Inject 1 mg into the skin once a week for 28 days., Disp: 2 mL, Rfl: 0   [START ON 04/09/2022] Semaglutide-Weight Management 1.7 MG/0.75ML SOAJ, Inject 1.7 mg into the skin once a week for 28 days., Disp: 3 mL, Rfl: 0   [START ON 05/08/2022] Semaglutide-Weight Management 2.4 MG/0.75ML SOAJ, Inject 2.4 mg into the skin once a week for 28 days., Disp: 3 mL, Rfl: 0   sildenafil (VIAGRA) 100 MG tablet, Take 1 tablet (100 mg total) by mouth daily as needed for erectile dysfunction., Disp: 30 tablet, Rfl: 2   testosterone cypionate (DEPOTESTOSTERONE CYPIONATE) 200 MG/ML injection, Inject 0.5 mLs (100 mg total) into the muscle every 7 (seven) days., Disp: 10 mL, Rfl: 1   Vitamin D, Cholecalciferol, 10  MCG (400 UNIT) CAPS, Take 1 capsule by mouth daily., Disp: , Rfl:    zolpidem (AMBIEN) 5 MG tablet, Take 1 tablet (5 mg total) by mouth at bedtime., Disp: 30 tablet, Rfl: 1  No Known Allergies Past Medical History:  Diagnosis Date   Arthritis    Dermatitis    head  and face    Hypertension    Insomnia    Palpitations    Sleep apnea    cpap    Testosterone deficiency     Observations/Objective: Today's Vitals   02/23/22 1316  BP: 138/83  Pulse: 71  Weight: (!) 363 lb 3.2 oz (164.7 kg)   Body mass index is 47.92 kg/m.  A&O  No  respiratory distress or wheezing audible over the phone Mood, judgement, and thought processes all WNL  Assessment and Plan: 1-2. Essential hypertension/Localized edema Improving.  HCTZ increased to twice daily. - hydrochlorothiazide (HYDRODIURIL) 12.5 MG tablet; Take 1 tablet (12.5 mg total) by mouth 2 (two) times daily.  Dispense: 180 tablet; Refill: 1  3. Primary insomnia Improving.  Ambien increased from 5 mg to 10 mg nightly. - zolpidem (AMBIEN) 10 MG tablet; Take 1 tablet (10 mg total) by mouth at bedtime.  Dispense: 30 tablet; Refill: 2  4. Morbid obesity (HCC) Patient has lost 22 pounds since our visit in April.  He is to go up to the next dose of Wegovy 0.5 mg weekly x4 weeks.  Continue healthy eating and activity.   Follow Up Instructions: Return in about 4 weeks (around 03/23/2022) for follow-up of chronic medication conditions.  I discussed the assessment and treatment plan with the patient. The patient was provided an opportunity to ask questions and all were answered. The patient agreed with the plan and demonstrated an understanding of the instructions.   The patient was advised to call back or seek an in-person evaluation if the symptoms worsen or if the condition fails to improve as anticipated.  The above assessment and management plan was discussed with the patient. The patient verbalized understanding of and has agreed to the management plan. Patient is aware to call the clinic if symptoms persist or worsen. Patient is aware when to return to the clinic for a follow-up visit. Patient educated on when it is appropriate to go to the emergency department.   Time call ended: 1:15 PM  I provided 11 minutes of non-face-to-face time during this encounter.  Deliah Boston, MSN, APRN, FNP-C [facility] 02/23/22

## 2022-03-13 ENCOUNTER — Other Ambulatory Visit: Payer: Self-pay

## 2022-03-13 DIAGNOSIS — E291 Testicular hypofunction: Secondary | ICD-10-CM

## 2022-03-13 NOTE — Telephone Encounter (Signed)
Patient pharmacy sent request for testosterone refill. Message sent to provider to refill if appropriate.

## 2022-03-14 MED ORDER — TESTOSTERONE CYPIONATE 200 MG/ML IM SOLN: 100.0000 mg | INTRAMUSCULAR | 1 refills | Status: DC

## 2022-04-11 ENCOUNTER — Other Ambulatory Visit: Payer: Self-pay | Admitting: Family Medicine

## 2022-04-11 DIAGNOSIS — I10 Essential (primary) hypertension: Secondary | ICD-10-CM

## 2022-04-11 DIAGNOSIS — L719 Rosacea, unspecified: Secondary | ICD-10-CM

## 2022-05-13 ENCOUNTER — Other Ambulatory Visit: Payer: Self-pay | Admitting: Family Medicine

## 2022-05-13 DIAGNOSIS — L719 Rosacea, unspecified: Secondary | ICD-10-CM

## 2022-06-22 ENCOUNTER — Other Ambulatory Visit: Payer: Self-pay | Admitting: Family Medicine

## 2022-06-22 DIAGNOSIS — F5101 Primary insomnia: Secondary | ICD-10-CM

## 2022-06-26 ENCOUNTER — Encounter: Payer: Self-pay | Admitting: Family Medicine

## 2022-06-26 ENCOUNTER — Ambulatory Visit (INDEPENDENT_AMBULATORY_CARE_PROVIDER_SITE_OTHER): Payer: 59 | Admitting: Family Medicine

## 2022-06-26 VITALS — BP 134/88 | HR 83 | Temp 98.8°F | Ht 73.0 in | Wt 338.0 lb

## 2022-06-26 DIAGNOSIS — S46811A Strain of other muscles, fascia and tendons at shoulder and upper arm level, right arm, initial encounter: Secondary | ICD-10-CM

## 2022-06-26 MED ORDER — CYCLOBENZAPRINE HCL 10 MG PO TABS: 10.0000 mg | ORAL_TABLET | Freq: Three times a day (TID) | ORAL | 0 refills | Status: DC | PRN

## 2022-06-26 MED ORDER — PREDNISONE 20 MG PO TABS: ORAL_TABLET | ORAL | 0 refills | Status: DC

## 2022-06-26 MED ORDER — METHYLPREDNISOLONE ACETATE 80 MG/ML IJ SUSP
60.0000 mg | Freq: Once | INTRAMUSCULAR | Status: AC
  Administered 2022-06-26: 60 mg via INTRAMUSCULAR

## 2022-06-26 NOTE — Progress Notes (Signed)
Subjective:  Patient ID: Francisco Adams, male    DOB: Feb 21, 1971, 51 y.o.   MRN: [id number]  Patient Care Team: Gwenlyn Fudge, FNP as PCP - General (Family Medicine)   Chief Complaint:  Neck Pain (Pt states this happened Friday , started hurting some Saturday - hurts more than before today - cat turn his head to the right )   HPI: Francisco Adams is a 51 y.o. male presenting on 06/26/2022 for Neck Pain (Pt states this happened Friday , started hurting some Saturday - hurts more than before today - cat turn his head to the right )   Neck Pain  This is a new problem. Episode onset: Saturday. The problem occurs constantly. The problem has been waxing and waning. The pain is associated with lifting a heavy object (was lifting heavy rafters over head). The pain is present in the right side. The quality of the pain is described as aching, shooting, stabbing and cramping. The pain is at a severity of 6/10. The pain is moderate. The symptoms are aggravated by position and twisting (movement, palpation). Pertinent negatives include no chest pain, fever, headaches, leg pain, numbness, pain with swallowing, paresis, photophobia, syncope, tingling, trouble swallowing, visual change, weakness or weight loss. He has tried acetaminophen, NSAIDs and heat for the symptoms. The treatment provided mild relief.     Relevant past medical, surgical, family, and social history reviewed and updated as indicated.  Allergies and medications reviewed and updated. Data reviewed: Chart in Epic.   Past Medical History:  Diagnosis Date   Arthritis    Dermatitis    head  and face    Hypertension    Insomnia    Palpitations    Sleep apnea    cpap    Testosterone deficiency     Past Surgical History:  Procedure Laterality Date   ANKLE RECONSTRUCTION Right    SPINE SURGERY     cervical - 3 disc fusions    Social History   Socioeconomic History   Marital status: Divorced    Spouse name: Not on file    Number of children: 3   Years of education: Not on file   Highest education level: Not on file  Occupational History   Not on file  Tobacco Use   Smoking status: Former   Smokeless tobacco: Current    Types: Chew  Vaping Use   Vaping Use: Never used  Substance and Sexual Activity   Alcohol use: Never   Drug use: Never   Sexual activity: Not on file  Other Topics Concern   Not on file  Social History Narrative   Not on file   Social Determinants of Health   Financial Resource Strain: Not on file  Food Insecurity: Not on file  Transportation Needs: Not on file  Physical Activity: Not on file  Stress: Not on file  Social Connections: Not on file  Intimate Partner Violence: Not on file    Outpatient Encounter Medications as of 06/26/2022  Medication Sig   albuterol (VENTOLIN HFA) 108 (90 Base) MCG/ACT inhaler INHALE 2 PUFFS INTO THE LUNGS EVERY 6 HOURS AS NEEDED FOR WHEEZING OR SHORTNESS OF BREATH   clotrimazole-betamethasone (LOTRISONE) cream APPLY CREAM TOPICALLY TWICE DAILY   cyclobenzaprine (FLEXERIL) 10 MG tablet Take 1 tablet (10 mg total) by mouth 3 (three) times daily as needed for muscle spasms.   doxycycline (PERIOSTAT) 20 MG tablet Take 1 tablet (20 mg total) by mouth 2 (two)  times daily.   hydrochlorothiazide (HYDRODIURIL) 12.5 MG tablet Take 1 tablet (12.5 mg total) by mouth 2 (two) times daily.   metoprolol succinate (TOPROL-XL) 25 MG 24 hr tablet Take 1 tablet by mouth once daily   Multiple Vitamin (MULTIVITAMIN) tablet Take 1 tablet by mouth daily.   olmesartan (BENICAR) 40 MG tablet Take 1 tablet by mouth once daily   predniSONE (DELTASONE) 20 MG tablet 2 po at sametime daily for 5 days- start tomorrow   sildenafil (VIAGRA) 100 MG tablet Take 1 tablet (100 mg total) by mouth daily as needed for erectile dysfunction.   testosterone cypionate (DEPOTESTOSTERONE CYPIONATE) 200 MG/ML injection Inject 0.5 mLs (100 mg total) into the muscle every 7 (seven) days.    Vitamin D, Cholecalciferol, 10 MCG (400 UNIT) CAPS Take 1 capsule by mouth daily.   zolpidem (AMBIEN) 10 MG tablet Take 1 tablet (10 mg total) by mouth at bedtime.   No facility-administered encounter medications on file as of 06/26/2022.    No Known Allergies  Review of Systems  Constitutional:  Negative for activity change, appetite change, chills, diaphoresis, fatigue, fever, unexpected weight change and weight loss.  HENT: Negative.  Negative for trouble swallowing.   Eyes: Negative.  Negative for photophobia.  Respiratory:  Negative for cough, chest tightness and shortness of breath.   Cardiovascular:  Negative for chest pain, palpitations, leg swelling and syncope.  Gastrointestinal:  Negative for abdominal pain, blood in stool, constipation, diarrhea, nausea and vomiting.  Endocrine: Negative.   Genitourinary:  Negative for decreased urine volume, difficulty urinating, dysuria, frequency and urgency.  Musculoskeletal:  Positive for arthralgias, myalgias, neck pain and neck stiffness. Negative for gait problem and joint swelling.  Skin: Negative.   Allergic/Immunologic: Negative.   Neurological:  Negative for dizziness, tingling, tremors, seizures, syncope, facial asymmetry, speech difficulty, weakness, light-headedness, numbness and headaches.  Hematological: Negative.   Psychiatric/Behavioral:  Negative for confusion, hallucinations, sleep disturbance and suicidal ideas.   All other systems reviewed and are negative.       Objective:  BP 134/88   Pulse 83   Temp 98.8 F (37.1 C)   Ht 6\' 1"  (1.854 m)   Wt (!) 338 lb (153.3 kg)   SpO2 96%   BMI 44.59 kg/m    Wt Readings from Last 3 Encounters:  06/26/22 (!) 338 lb (153.3 kg)  02/23/22 (!) 363 lb 3.2 oz (164.7 kg)  01/12/22 (!) 385 lb (174.6 kg)    Physical Exam Vitals and nursing note reviewed.  Constitutional:      General: He is not in acute distress.    Appearance: Normal appearance. He is obese. He is not  ill-appearing, toxic-appearing or diaphoretic.  HENT:     Head: Normocephalic and atraumatic.     Nose: Nose normal.     Mouth/Throat:     Mouth: Mucous membranes are moist.  Eyes:     Extraocular Movements: Extraocular movements intact.     Conjunctiva/sclera: Conjunctivae normal.     Pupils: Pupils are equal, round, and reactive to light.  Neck:   Cardiovascular:     Rate and Rhythm: Normal rate and regular rhythm.     Heart sounds: Normal heart sounds. No murmur heard.    No friction rub. No gallop.  Pulmonary:     Breath sounds: Normal breath sounds.  Musculoskeletal:     Right shoulder: Normal.     Left shoulder: Normal.     Cervical back: Neck supple. Spasms and tenderness present. No  swelling, edema, deformity, erythema, signs of trauma, lacerations, rigidity, torticollis, bony tenderness or crepitus. Pain with movement and muscular tenderness present. No spinous process tenderness. Decreased range of motion.     Thoracic back: Normal.     Right lower leg: No edema.     Left lower leg: No edema.  Skin:    General: Skin is warm and dry.     Capillary Refill: Capillary refill takes less than 2 seconds.  Neurological:     General: No focal deficit present.     Mental Status: He is alert and oriented to person, place, and time.  Psychiatric:        Mood and Affect: Mood normal.        Behavior: Behavior normal.        Thought Content: Thought content normal.        Judgment: Judgment normal.     Results for orders placed or performed in visit on 09/11/21  PSA  Result Value Ref Range   Prostate Specific Ag, Serum 0.6 0.0 - 4.0 ng/mL  Testosterone  Result Value Ref Range   Testosterone 504 264 - 916 ng/dL       Pertinent labs & imaging results that were available during my care of the patient were reviewed by me and considered in my medical decision making.  Assessment & Plan:  Gabryel was seen today for neck pain.  Diagnoses and all orders for this  visit:  Trapezius muscle strain, right, initial encounter Spasm of right trapezius muscle noted on exam. No bony tenderness or crepitus. Will burst with steroids and provide muscle relaxer for as needed use. Sedation precautions provided. Symptomatic care discussed in detail. Report new, worsening, or persistent symptoms.  -     cyclobenzaprine (FLEXERIL) 10 MG tablet; Take 1 tablet (10 mg total) by mouth 3 (three) times daily as needed for muscle spasms. -     predniSONE (DELTASONE) 20 MG tablet; 2 po at sametime daily for 5 days- start tomorrow     Continue all other maintenance medications.  Follow up plan: Return in about 6 weeks (around 08/07/2022), or if symptoms worsen or fail to improve, for CPE with labs.   Continue healthy lifestyle choices, including diet (rich in fruits, vegetables, and lean proteins, and low in salt and simple carbohydrates) and exercise (at least 30 minutes of moderate physical activity daily).  Educational handout given for muscle spasm  The above assessment and management plan was discussed with the patient. The patient verbalized understanding of and has agreed to the management plan. Patient is aware to call the clinic if they develop any new symptoms or if symptoms persist or worsen. Patient is aware when to return to the clinic for a follow-up visit. Patient educated on when it is appropriate to go to the emergency department.   Monia Pouch, FNP-C [facility] [phone]

## 2022-06-28 ENCOUNTER — Other Ambulatory Visit: Payer: Self-pay | Admitting: Family Medicine

## 2022-06-28 ENCOUNTER — Other Ambulatory Visit: Payer: Self-pay | Admitting: Nurse Practitioner

## 2022-06-28 DIAGNOSIS — L719 Rosacea, unspecified: Secondary | ICD-10-CM

## 2022-06-28 DIAGNOSIS — F5101 Primary insomnia: Secondary | ICD-10-CM

## 2022-10-07 ENCOUNTER — Other Ambulatory Visit: Payer: Self-pay | Admitting: Family Medicine

## 2022-10-07 DIAGNOSIS — I10 Essential (primary) hypertension: Secondary | ICD-10-CM

## 2022-10-08 ENCOUNTER — Ambulatory Visit (INDEPENDENT_AMBULATORY_CARE_PROVIDER_SITE_OTHER): Payer: 59 | Admitting: Nurse Practitioner

## 2022-10-08 ENCOUNTER — Encounter: Payer: Self-pay | Admitting: Nurse Practitioner

## 2022-10-08 ENCOUNTER — Ambulatory Visit (HOSPITAL_COMMUNITY)
Admission: RE | Admit: 2022-10-08 | Discharge: 2022-10-08 | Disposition: A | Discharge status: Home / Self Care | Payer: 59 | Source: Ambulatory Visit | Attending: Nurse Practitioner | Admitting: Nurse Practitioner

## 2022-10-08 VITALS — BP 144/83 | HR 84 | Temp 98.4°F | Ht 73.0 in | Wt 346.0 lb

## 2022-10-08 DIAGNOSIS — K909 Intestinal malabsorption, unspecified: Secondary | ICD-10-CM | POA: Diagnosis present

## 2022-10-08 MED ORDER — LOPERAMIDE HCL 2 MG PO TABS: 4.0000 mg | ORAL_TABLET | Freq: Four times a day (QID) | ORAL | 0 refills | Status: DC | PRN

## 2022-10-08 NOTE — Progress Notes (Signed)
Acute Office Visit  Subjective:     Patient ID: MAVRIC CORTRIGHT, male    DOB: 22-Mar-1971, 52 y.o.   MRN: [id number]  Chief Complaint  Patient presents with   GI Problem    Pt states he has been having foaming yellow stool , cramps     Abdominal Pain This is a recurrent problem. The current episode started 1 to 4 weeks ago. The onset quality is gradual. The problem occurs intermittently. The problem has been unchanged. The pain is located in the RUQ. The pain is mild. The quality of the pain is aching. The abdominal pain does not radiate. Associated symptoms include diarrhea. Pertinent negatives include no fever, nausea or vomiting. Associated symptoms comments: steatorrhea. Nothing aggravates the pain. The pain is relieved by Nothing. He has tried nothing for the symptoms.  Diarrhea  This is a recurrent problem. The problem occurs 2 to 4 times per day. The problem has been unchanged. The stool consistency is described as Watery. The patient states that diarrhea does not awaken him from sleep. Associated symptoms include abdominal pain. Pertinent negatives include no fever or vomiting. Nothing aggravates the symptoms. There are no known risk factors. He has tried anti-motility drug for the symptoms. The treatment provided no relief.    Review of Systems  Constitutional: Negative.  Negative for fever.  HENT: Negative.    Respiratory: Negative.    Cardiovascular: Negative.   Gastrointestinal:  Positive for abdominal pain and diarrhea. Negative for nausea and vomiting.  Genitourinary: Negative.   Skin:  Positive for itching. Negative for rash.  Neurological: Negative.   All other systems reviewed and are negative.       Objective:    BP (!) 144/83   Pulse 84   Temp 98.4 F (36.9 C)   Ht 6\' 1"  (1.854 m)   Wt (!) 346 lb (156.9 kg)   SpO2 96%   BMI 45.65 kg/m  BP Readings from Last 3 Encounters:  10/08/22 (!) 144/83  06/26/22 134/88  02/23/22 138/83   Wt Readings from Last  3 Encounters:  10/08/22 (!) 346 lb (156.9 kg)  06/26/22 (!) 338 lb (153.3 kg)  02/23/22 (!) 363 lb 3.2 oz (164.7 kg)      Physical Exam Vitals and nursing note reviewed.  Constitutional:      Appearance: Normal appearance. He is normal weight.  HENT:     Right Ear: External ear normal.     Left Ear: External ear normal.     Nose: Nose normal.     Mouth/Throat:     Pharynx: Oropharynx is clear.  Eyes:     Conjunctiva/sclera: Conjunctivae normal.  Cardiovascular:     Rate and Rhythm: Normal rate and regular rhythm.     Pulses: Normal pulses.     Heart sounds: Normal heart sounds.  Pulmonary:     Effort: Pulmonary effort is normal.     Breath sounds: Normal breath sounds.  Abdominal:     General: Bowel sounds are normal. There is no distension.     Palpations: There is no mass.     Tenderness: There is abdominal tenderness. There is no right CVA tenderness or left CVA tenderness.  Skin:    General: Skin is warm.     Findings: No erythema or rash.  Neurological:     General: No focal deficit present.     Mental Status: He is alert and oriented to person, place, and time.  Psychiatric:  Mood and Affect: Mood normal.        Behavior: Behavior normal.     No results found for any visits on 10/08/22.      Assessment & Plan:  Patient presents with on going steatorrhea in the past few weeks, he describes stool as yellow foamy and watery. Completed referral to [place] to rule out pancreatic/gallbladder disease.  RX for imodium dent to pharmacy. Follow up with unresolved symptoms. Problem List Items Addressed This Visit   None Visit Diagnoses     Steatorrhea    -  Primary   Relevant Medications   loperamide (IMODIUM A-D) 2 MG tablet   Other Relevant Orders   US Abdomen Limited       Meds ordered this encounter  Medications   loperamide (IMODIUM A-D) 2 MG tablet    Sig: Take 2 tablets (4 mg total) by mouth 4 (four) times daily as needed for diarrhea or loose  stools.    Dispense:  30 tablet    Refill:  0    Order Specific Question:   Supervising Provider    Answer:   Claretta Fraise [phone]    Return if symptoms worsen or fail to improve.  Ivy Lynn, NP

## 2022-10-08 NOTE — Patient Instructions (Signed)
Diarrhea, Adult Diarrhea is frequent loose and sometimes watery bowel movements. Diarrhea can make you feel weak and cause you to become dehydrated. Dehydration is a condition in which there is not enough water or other fluids in the body. Dehydration can make you tired and thirsty, cause you to have a dry mouth, and decrease how often you urinate. Diarrhea typically lasts 2-3 days. However, it can last longer if it is a sign of something more serious. It is important to treat your diarrhea as told by your health care provider. Follow these instructions at home: Eating and drinking     Follow these recommendations as told by your health care provider: Take an oral rehydration solution (ORS). This is an over-the-counter medicine that helps return your body to its normal balance of nutrients and water. It is found at pharmacies and retail stores. Drink enough fluid to keep your urine pale yellow. Drink fluids such as water, diluted fruit juice, and low-calorie sports drinks. You can drink milk also, if desired. Sucking on ice chips is another way to get fluids. Avoid drinking fluids that contain a lot of sugar or caffeine, such as soda, energy drinks, and regular sports drinks. Avoid alcohol. Eat bland, easy-to-digest foods in small amounts as you are able. These foods include bananas, applesauce, rice, lean meats, toast, and crackers. Avoid spicy or fatty foods.  Medicines Take over-the-counter and prescription medicines only as told by your health care provider. If you were prescribed antibiotics, take them as told by your health care provider. Do not stop using the antibiotic even if you start to feel better. General instructions  Wash your hands often using soap and water for at least 20 seconds. If soap and water are not available, use hand sanitizer. Others in the household should wash their hands as well. Hands should be washed: After using the toilet or changing a diaper. Before  preparing, cooking, or serving food. While caring for a sick person or while visiting someone in a hospital. Rest at home while you recover. Take a warm bath to relieve any burning or pain from frequent diarrhea episodes. Watch your condition for any changes. Contact a health care provider if: You have a fever. Your diarrhea gets worse. You have new symptoms. You vomit every time you eat or drink. You feel light-headed, dizzy, or have a headache. You have muscle cramps. You have signs of dehydration, such as: Dark urine, very little urine, or no urine. Cracked lips. Dry mouth. Sunken eyes. Sleepiness. Weakness. You have bloody or black stools or stools that look like tar. You have severe pain, cramping, or bloating in your abdomen. Your skin feels cold and clammy. You feel confused. Get help right away if: You have chest pain or your heart is beating very quickly. You have trouble breathing or you are breathing very quickly. You feel extremely weak or you faint. These symptoms may be an emergency. Get help right away. Call 911. Do not wait to see if the symptoms will go away. Do not drive yourself to the hospital. This information is not intended to replace advice given to you by your health care provider. Make sure you discuss any questions you have with your health care provider. Document Revised: 02/27/2022 Document Reviewed: 02/27/2022 Elsevier Patient Education  [place].

## 2022-10-09 ENCOUNTER — Other Ambulatory Visit: Payer: Self-pay | Admitting: Family Medicine

## 2022-10-09 DIAGNOSIS — I10 Essential (primary) hypertension: Secondary | ICD-10-CM

## 2022-10-12 ENCOUNTER — Encounter: Payer: 59 | Admitting: Family Medicine

## 2022-12-07 ENCOUNTER — Telehealth: Payer: Self-pay | Admitting: Family Medicine

## 2022-12-07 NOTE — Telephone Encounter (Signed)
Not a rakes patient - patient aware he will have to wait to discuss with new PCP.  Patient only seen Sharyn Lull for acute visit.

## 2022-12-07 NOTE — Telephone Encounter (Signed)
  Prescription Request  12/07/2022  Is this a "Controlled Substance" medicine? testosterone cypionate (DEPOTESTOSTERONE CYPIONATE) 200 MG/ML injection   Have you seen your PCP in the last 2 weeks? No. Pt schedule with [facility] 01/04/2023  If YES, route message to pool  -  If NO, patient needs to be scheduled for appointment.  What is the name of the medication or equipment? testosterone cypionate (DEPOTESTOSTERONE CYPIONATE) 200 MG/ML injection   Have you contacted your pharmacy to request a refill? Yes NA   Which pharmacy would you like this sent to? [facility] in [place]   Patient notified that their request is being sent to the clinical staff for review and that they should receive a response within 2 business days.

## 2022-12-07 NOTE — Telephone Encounter (Signed)
Last office visit 06/26/22 Upcoming appointment 01/04/23 Last refill 03/14/22, 10 ml 1 refill

## 2022-12-13 ENCOUNTER — Telehealth (INDEPENDENT_AMBULATORY_CARE_PROVIDER_SITE_OTHER): Payer: 59 | Admitting: Family Medicine

## 2022-12-13 ENCOUNTER — Encounter: Payer: Self-pay | Admitting: Family Medicine

## 2022-12-13 DIAGNOSIS — J069 Acute upper respiratory infection, unspecified: Secondary | ICD-10-CM | POA: Diagnosis not present

## 2022-12-13 DIAGNOSIS — J301 Allergic rhinitis due to pollen: Secondary | ICD-10-CM | POA: Diagnosis not present

## 2022-12-13 MED ORDER — PREDNISONE 20 MG PO TABS: 40.0000 mg | ORAL_TABLET | Freq: Every day | ORAL | 0 refills | Status: AC

## 2022-12-13 MED ORDER — GUAIFENESIN ER 600 MG PO TB12: 600.0000 mg | ORAL_TABLET | Freq: Two times a day (BID) | ORAL | 0 refills | Status: AC

## 2022-12-13 MED ORDER — FLUTICASONE PROPIONATE 50 MCG/ACT NA SUSP: 2.0000 | Freq: Every day | NASAL | 6 refills | Status: DC

## 2022-12-13 MED ORDER — LEVOCETIRIZINE DIHYDROCHLORIDE 5 MG PO TABS: 5.0000 mg | ORAL_TABLET | Freq: Every evening | ORAL | 3 refills | Status: DC

## 2022-12-13 MED ORDER — AMOXICILLIN-POT CLAVULANATE 875-125 MG PO TABS: 1.0000 | ORAL_TABLET | Freq: Two times a day (BID) | ORAL | 0 refills | Status: AC

## 2022-12-13 NOTE — Progress Notes (Addendum)
Virtual Visit via Video   I connected with patient on 12/13/22 at 1300 by a video enabled telemedicine application and verified that I am speaking with the correct person using two identifiers.  Location patient: Home Location provider: [place] participating in the virtual visit: Patient and Provider  I discussed the limitations of evaluation and management by telemedicine and the availability of in person appointments. The patient expressed understanding and agreed to proceed.  Subjective:   HPI:  Pt presents today for  Chief Complaint  Patient presents with   URI   Upper Respiratory Infection: Patient complains of symptoms of a URI, possible sinusitis. Symptoms include congestion, coryza, cough, fever, plugged sensation in both ears, and sore throat. Onset of symptoms was 10 days ago, gradually worsening since that time. He also c/o achiness, congestion, nasal congestion, non productive cough, post nasal drip, sinus pressure, sneezing, and sore throat for the past 10 days .  He is drinking plenty of fluids. Evaluation to date: none. Treatment to date: antihistamines and decongestants.    Review of Systems  Constitutional:  Positive for chills, fever and malaise/fatigue. Negative for diaphoresis and weight loss.  HENT:  Positive for congestion, ear pain, sinus pain and sore throat. Negative for ear discharge, hearing loss, nosebleeds and tinnitus.   Respiratory:  Positive for cough. Negative for hemoptysis, sputum production, shortness of breath, wheezing and stridor.   Cardiovascular:  Negative for chest pain, palpitations, orthopnea, claudication, leg swelling and PND.  Gastrointestinal:  Negative for abdominal pain, diarrhea, nausea and vomiting.  Genitourinary:  Negative for dysuria, frequency and urgency.  Musculoskeletal:  Positive for myalgias. Negative for back pain, falls, joint pain and neck pain.  Skin:  Negative for itching and  rash.  Neurological:  Positive for headaches. Negative for dizziness, tingling, tremors, sensory change, speech change, focal weakness, seizures, loss of consciousness and weakness.     Patient Active Problem List   Diagnosis Date Noted   Sleep apnea treated with nocturnal BiPAP 10/23/2021   Primary erectile dysfunction 07/14/2021   Seasonal allergies 06/24/2020   Rosacea 06/23/2019   Hypertriglyceridemia 06/23/2019   Morbid obesity ([place]) 05/06/2019   Essential hypertension 05/26/2015   Localized edema 05/26/2015   Hypogonadism in male 08/30/2014    Social History   Tobacco Use   Smoking status: Former   Smokeless tobacco: Current    Types: Chew  Substance Use Topics   Alcohol use: Never    Current Outpatient Medications:    amoxicillin-clavulanate (AUGMENTIN) 875-125 MG tablet, Take 1 tablet by mouth 2 (two) times daily for 10 days., Disp: 20 tablet, Rfl: 0   fluticasone (FLONASE) 50 MCG/ACT nasal spray, Place 2 sprays into both nostrils daily., Disp: 16 g, Rfl: 6   guaiFENesin (MUCINEX) 600 MG 12 hr tablet, Take 1 tablet (600 mg total) by mouth 2 (two) times daily for 10 days., Disp: 20 tablet, Rfl: 0   levocetirizine (XYZAL) 5 MG tablet, Take 1 tablet (5 mg total) by mouth every evening., Disp: 90 tablet, Rfl: 3   predniSONE (DELTASONE) 20 MG tablet, Take 2 tablets (40 mg total) by mouth daily with breakfast for 5 days. 2 po daily for 5 days, Disp: 10 tablet, Rfl: 0   albuterol (VENTOLIN HFA) 108 (90 Base) MCG/ACT inhaler, INHALE 2 PUFFS INTO THE LUNGS EVERY 6 HOURS AS NEEDED FOR WHEEZING OR SHORTNESS OF BREATH, Disp: 20.1 g, Rfl: 0   clotrimazole-betamethasone (LOTRISONE) cream, APPLY TOPICALLY TWICE DAILY, Disp: 30 g,  Rfl: 1   cyclobenzaprine (FLEXERIL) 10 MG tablet, Take 1 tablet (10 mg total) by mouth 3 (three) times daily as needed for muscle spasms., Disp: 30 tablet, Rfl: 0   doxycycline (PERIOSTAT) 20 MG tablet, Take 1 tablet (20 mg total) by mouth 2 (two) times daily.,  Disp: 60 tablet, Rfl: 2   hydrochlorothiazide (HYDRODIURIL) 12.5 MG tablet, Take 1 tablet by mouth twice daily, Disp: 180 tablet, Rfl: 0   loperamide (IMODIUM A-D) 2 MG tablet, Take 2 tablets (4 mg total) by mouth 4 (four) times daily as needed for diarrhea or loose stools., Disp: 30 tablet, Rfl: 0   metoprolol succinate (TOPROL-XL) 25 MG 24 hr tablet, Take 1 tablet by mouth once daily, Disp: 90 tablet, Rfl: 0   Multiple Vitamin (MULTIVITAMIN) tablet, Take 1 tablet by mouth daily., Disp: , Rfl:    olmesartan (BENICAR) 40 MG tablet, Take 1 tablet by mouth once daily, Disp: 90 tablet, Rfl: 0   sildenafil (VIAGRA) 100 MG tablet, Take 1 tablet (100 mg total) by mouth daily as needed for erectile dysfunction., Disp: 30 tablet, Rfl: 2   testosterone cypionate (DEPOTESTOSTERONE CYPIONATE) 200 MG/ML injection, Inject 0.5 mLs (100 mg total) into the muscle every 7 (seven) days., Disp: 10 mL, Rfl: 1   Vitamin D, Cholecalciferol, 10 MCG (400 UNIT) CAPS, Take 1 capsule by mouth daily., Disp: , Rfl:    zolpidem (AMBIEN) 10 MG tablet, Take 1 tablet (10 mg total) by mouth at bedtime., Disp: 30 tablet, Rfl: 2  No Known Allergies  Objective:   There were no vitals taken for this visit.  Patient is well-developed, well-nourished in no acute distress.  Resting comfortably at home.  Head is normocephalic, atraumatic.  No labored breathing.  Speech is clear and coherent with logical content.  Patient is alert and oriented at baseline.    Assessment and Plan:   Francisco Adams was seen today for uri.  Diagnoses and all orders for this visit:  URI with cough and congestion Ongoing and worsening symptoms over the last 10 days. Has tried and failed symptomatic care at home. Will initiate below. Report new, worsening, or persistent symptoms. Continue symptomatic care at home.  -     amoxicillin-clavulanate (AUGMENTIN) 875-125 MG tablet; Take 1 tablet by mouth 2 (two) times daily for 10 days. -     predniSONE  (DELTASONE) 20 MG tablet; Take 2 tablets (40 mg total) by mouth daily with breakfast for 5 days. 2 po daily for 5 days -     guaiFENesin (MUCINEX) 600 MG 12 hr tablet; Take 1 tablet (600 mg total) by mouth 2 (two) times daily for 10 days.  Seasonal allergic rhinitis due to pollen Has been on Zyrtec for a while, will switch to Xyzal to see if more beneficial. Restart Flonase.  -     fluticasone (FLONASE) 50 MCG/ACT nasal spray; Place 2 sprays into both nostrils daily. -     levocetirizine (XYZAL) 5 MG tablet; Take 1 tablet (5 mg total) by mouth every evening.      Return if symptoms worsen or fail to improve.  Monia Pouch, FNP-C [facility] [address] [place], [place] [address] [phone]  12/13/2022  Time spent with the patient: 12 minutes, of which >50% was spent in obtaining information about symptoms, reviewing previous labs, evaluations, and treatments, counseling about condition (please see the discussed topics above), and developing a plan to further investigate it; had a number of questions which I addressed.

## 2023-01-02 ENCOUNTER — Other Ambulatory Visit: Payer: Self-pay | Admitting: Family Medicine

## 2023-01-02 DIAGNOSIS — I10 Essential (primary) hypertension: Secondary | ICD-10-CM

## 2023-01-04 ENCOUNTER — Encounter: Payer: Self-pay | Admitting: Family Medicine

## 2023-01-04 ENCOUNTER — Ambulatory Visit (INDEPENDENT_AMBULATORY_CARE_PROVIDER_SITE_OTHER): Payer: 59 | Admitting: Family Medicine

## 2023-01-04 VITALS — BP 120/70 | HR 50 | Temp 98.5°F | Ht 73.0 in | Wt 353.0 lb

## 2023-01-04 DIAGNOSIS — N529 Male erectile dysfunction, unspecified: Secondary | ICD-10-CM

## 2023-01-04 DIAGNOSIS — R200 Anesthesia of skin: Secondary | ICD-10-CM

## 2023-01-04 DIAGNOSIS — E291 Testicular hypofunction: Secondary | ICD-10-CM | POA: Diagnosis not present

## 2023-01-04 DIAGNOSIS — L719 Rosacea, unspecified: Secondary | ICD-10-CM | POA: Diagnosis not present

## 2023-01-04 DIAGNOSIS — R202 Paresthesia of skin: Secondary | ICD-10-CM

## 2023-01-04 DIAGNOSIS — Z87898 Personal history of other specified conditions: Secondary | ICD-10-CM

## 2023-01-04 DIAGNOSIS — I10 Essential (primary) hypertension: Secondary | ICD-10-CM

## 2023-01-04 DIAGNOSIS — R5383 Other fatigue: Secondary | ICD-10-CM

## 2023-01-04 DIAGNOSIS — J301 Allergic rhinitis due to pollen: Secondary | ICD-10-CM

## 2023-01-04 DIAGNOSIS — M25521 Pain in right elbow: Secondary | ICD-10-CM | POA: Diagnosis not present

## 2023-01-04 MED ORDER — PREDNISONE 20 MG PO TABS
40.0000 mg | ORAL_TABLET | Freq: Every day | ORAL | 0 refills | Status: AC
Start: 2023-01-04 — End: 2023-01-09

## 2023-01-04 MED ORDER — CLOTRIMAZOLE-BETAMETHASONE 1-0.05 % EX CREA: TOPICAL_CREAM | Freq: Two times a day (BID) | CUTANEOUS | 1 refills | Status: DC

## 2023-01-04 MED ORDER — LEVOCETIRIZINE DIHYDROCHLORIDE 5 MG PO TABS
5.0000 mg | ORAL_TABLET | Freq: Every evening | ORAL | 3 refills | Status: AC
Start: 2023-01-04 — End: ?

## 2023-01-04 MED ORDER — FLUTICASONE PROPIONATE 50 MCG/ACT NA SUSP
2.0000 | Freq: Every day | NASAL | 6 refills | Status: DC
Start: 2023-01-04 — End: 2024-03-04

## 2023-01-04 MED ORDER — SILDENAFIL CITRATE 100 MG PO TABS
100.0000 mg | ORAL_TABLET | Freq: Every day | ORAL | 2 refills | Status: DC | PRN
Start: 2023-01-04 — End: 2023-11-29

## 2023-01-04 MED ORDER — HYDROCHLOROTHIAZIDE 12.5 MG PO TABS
12.5000 mg | ORAL_TABLET | Freq: Two times a day (BID) | ORAL | 0 refills | Status: DC
Start: 2023-01-04 — End: 2023-11-29

## 2023-01-04 MED ORDER — METOPROLOL SUCCINATE ER 25 MG PO TB24
25.0000 mg | ORAL_TABLET | Freq: Every day | ORAL | 0 refills | Status: DC
Start: 2023-01-04 — End: 2023-04-24

## 2023-01-04 MED ORDER — OLMESARTAN MEDOXOMIL 40 MG PO TABS
40.0000 mg | ORAL_TABLET | Freq: Every day | ORAL | 0 refills | Status: AC
Start: 2023-01-04 — End: 2023-04-04

## 2023-01-04 NOTE — Progress Notes (Signed)
Acute Office Visit  Subjective:  Patient ID: Francisco Adams, male    DOB: Jan 08, 1971, 52 y.o.   MRN: [id number]  Chief Complaint  Patient presents with   Establish Care    Right elbow, hurts to straighten   HPI Patient is in today for right elbow pain that started a month ago.  Happened 4-5 times in the last week. Has pain with flexion and extension. Points to back of arm where there is point tenderness.  Has decreased ROM due to pain. 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 Middle finger has also been making new "noise" with movement  Denies trauma to the site. 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  Endorses new numbness and tingling in his hand.  This past Wednesday started stretches with a person that comes out to work.  Cervical spine fusion completed with [facility]   States that he needs medication refills on all medications.   Allergies have worsened with pollen.  Follows with Urology for Testosterone.   ROS As per HPI Objective:  BP 120/70   Pulse (!) 50   Temp 98.5 F (36.9 C)   Ht  (1.854 m)   Wt (!) 353 lb (160.1 kg)   SpO2 96%   BMI 46.57 kg/m   Physical Exam Constitutional:      General: He is not in acute distress.    Appearance: Normal appearance. He is not ill-appearing, toxic-appearing or diaphoretic.  Cardiovascular:     Rate and Rhythm: Normal rate.     Pulses: Normal pulses.     Heart sounds: Normal heart sounds. No murmur heard.    No gallop.  Pulmonary:     Effort: Pulmonary effort is normal. No respiratory distress.     Breath sounds: Normal breath sounds. No stridor. No wheezing, rhonchi or rales.  Musculoskeletal:     Right elbow: Decreased range of motion.     Right hand: Tenderness present. Decreased range of motion. Decreased strength.     Comments: Crepitus present in R elbow and R middle finger   Skin:     General: Skin is warm.     Capillary Refill: Capillary refill takes less than 2 seconds.  Neurological:     General: No focal deficit present.     Mental Status: He is alert and oriented to person, place, and time. Mental status is at baseline.     Motor: No weakness.  Psychiatric:        Mood and Affect: Mood normal.        Behavior: Behavior normal.        Thought Content: Thought content normal.        Judgment: Judgment normal.    Assessment & Plan:  1. Right elbow pain Will repeat imaging as below as patient has not had imaging of elbow or spine in >10 years. Will start with conservative management as below.  - DG Hand Complete Right; Future - predniSONE (DELTASONE) 20 MG tablet; Take 2 tablets (40 mg total) by mouth daily with breakfast for 5 days.  Dispense: 10 tablet; Refill: 0 - DG Elbow 2 Views Right; Future  2. Numbness and tingling in right hand Will repeat imaging as below as patient has not had imaging of elbow or spine in >10 years. Will start with conservative management as below. If patient does not improve  or has significant changes on imaging we can refer patient to neurosurgery/ortho for follow up.  - predniSONE (DELTASONE) 20 MG tablet; Take 2 tablets (40 mg total) by mouth daily with breakfast for 5 days.  Dispense: 10 tablet; Refill: 0 - DG Cervical Spine Complete; Future  3. Rosacea Will refill medication as below for control of rosacea. Discussed with patient potential for atrophy. Patient has been using Lotrisone for ~3 years without referral to Dermatology. Will discuss referral to Dermatology at next appt.  - clotrimazole-betamethasone (LOTRISONE) cream; Apply topically 2 (two) times daily.  Dispense: 30 g; Refill: 1  4. Hypogonadism in male Referred patient back to urology for refill of testosterone.   5. Primary erectile dysfunction Refill placed as below.  - sildenafil (VIAGRA) 100 MG tablet; Take 1 tablet (100 mg total) by mouth daily as needed for  erectile dysfunction.  Dispense: 30 tablet; Refill: 2  6. Primary hypertension Well controlled on current regimen. Per patient he is taking metoprolol for control of palpitations.  - olmesartan (BENICAR) 40 MG tablet; Take 1 tablet (40 mg total) by mouth daily.  Dispense: 90 tablet; Refill: 0 - hydrochlorothiazide (HYDRODIURIL) 12.5 MG tablet; Take 1 tablet (12.5 mg total) by mouth 2 (two) times daily.  Dispense: 180 tablet; Refill: 0 - metoprolol succinate (TOPROL-XL) 25 MG 24 hr tablet; Take 1 tablet (25 mg total) by mouth daily.  Dispense: 30 tablet; Refill: 0  7. History of palpitations Well controlled on metoprolol.   8. Seasonal allergic rhinitis due to pollen Refills placed as below.  - fluticasone (FLONASE) 50 MCG/ACT nasal spray; Place 2 sprays into both nostrils daily.  Dispense: 16 g; Refill: 6 - levocetirizine (XYZAL) 5 MG tablet; Take 1 tablet (5 mg total) by mouth every evening.  Dispense: 90 tablet; Refill: 3  9. Other fatigue Labs as below. Will communicate results to patient once available.  Patient is not fasting today. Has follow up in one month, will collect remaining labs at that time.  - VITAMIN D 25 Hydroxy (Vit-D Deficiency, Fractures)  The above assessment and management plan was discussed with the patient. The patient verbalized understanding of and has agreed to the management plan using shared-decision making. Patient is aware to call the clinic if they develop any new symptoms or if symptoms fail to improve or worsen. Patient is aware when to return to the clinic for a follow-up visit. Patient educated on when it is appropriate to go to the emergency department.   Neale Burly, DNP-FNP [facility] [address] [place], [place] [address] [phone]

## 2023-01-04 NOTE — Patient Instructions (Signed)
[facility]   Jerilee Field

## 2023-01-05 LAB — VITAMIN D 25 HYDROXY (VIT D DEFICIENCY, FRACTURES): Vit D, 25-Hydroxy: 45 ng/mL (ref 30.0–100.0)

## 2023-01-21 ENCOUNTER — Telehealth: Payer: Self-pay

## 2023-01-21 NOTE — Telephone Encounter (Signed)
Pharmacy requesting a refill of testosterone.  Pt last seen on 09/11/2021, no f/u scheduled.  Please refill if appropriate.

## 2023-01-21 NOTE — Telephone Encounter (Signed)
Left voicemail for pt to return call to scheduled f/u apt.

## 2023-02-01 ENCOUNTER — Ambulatory Visit: Payer: 59 | Admitting: Family Medicine

## 2023-03-16 ENCOUNTER — Other Ambulatory Visit: Payer: Self-pay | Admitting: Family Medicine

## 2023-03-26 NOTE — Telephone Encounter (Signed)
Pt called stating that the pharmacy now has the weight loss shot in stock that his PCP tried prescribing him last year, if PCP can send new Rx to pharmacy.

## 2023-03-27 NOTE — Telephone Encounter (Signed)
Appt made he already checked and they cover wegovy FYI

## 2023-04-05 ENCOUNTER — Ambulatory Visit (INDEPENDENT_AMBULATORY_CARE_PROVIDER_SITE_OTHER): Payer: 59 | Admitting: Family Medicine

## 2023-04-05 ENCOUNTER — Encounter: Payer: Self-pay | Admitting: Family Medicine

## 2023-04-05 DIAGNOSIS — I10 Essential (primary) hypertension: Secondary | ICD-10-CM

## 2023-04-05 DIAGNOSIS — R600 Localized edema: Secondary | ICD-10-CM

## 2023-04-05 DIAGNOSIS — E781 Pure hyperglyceridemia: Secondary | ICD-10-CM

## 2023-04-05 DIAGNOSIS — G4730 Sleep apnea, unspecified: Secondary | ICD-10-CM

## 2023-04-05 LAB — THYROID PANEL WITH TSH

## 2023-04-05 LAB — CMP14+EGFR

## 2023-04-05 LAB — LIPID PANEL

## 2023-04-05 LAB — BAYER DCA HB A1C WAIVED: HB A1C (BAYER DCA - WAIVED): 5.2 % (ref 4.8–5.6)

## 2023-04-05 LAB — VITAMIN D 25 HYDROXY (VIT D DEFICIENCY, FRACTURES)

## 2023-04-05 MED ORDER — SEMAGLUTIDE-WEIGHT MANAGEMENT 2.4 MG/0.75ML ~~LOC~~ SOAJ
2.4000 mg | SUBCUTANEOUS | 0 refills | Status: AC
Start: 2023-07-30 — End: 2023-08-27

## 2023-04-05 MED ORDER — SEMAGLUTIDE-WEIGHT MANAGEMENT 1.7 MG/0.75ML ~~LOC~~ SOAJ
1.7000 mg | SUBCUTANEOUS | 0 refills | Status: AC
Start: 2023-07-01 — End: 2023-07-29

## 2023-04-05 MED ORDER — SEMAGLUTIDE-WEIGHT MANAGEMENT 0.25 MG/0.5ML ~~LOC~~ SOAJ
0.2500 mg | SUBCUTANEOUS | 0 refills | Status: AC
Start: 2023-04-05 — End: 2023-05-03

## 2023-04-05 MED ORDER — SEMAGLUTIDE-WEIGHT MANAGEMENT 0.5 MG/0.5ML ~~LOC~~ SOAJ
0.5000 mg | SUBCUTANEOUS | 0 refills | Status: AC
Start: 2023-05-04 — End: 2023-06-01

## 2023-04-05 MED ORDER — SEMAGLUTIDE-WEIGHT MANAGEMENT 1 MG/0.5ML ~~LOC~~ SOAJ
1.0000 mg | SUBCUTANEOUS | 0 refills | Status: AC
Start: 2023-06-02 — End: 2023-06-30

## 2023-04-05 NOTE — Progress Notes (Signed)
Acute Office Visit  Subjective:  Patient ID: Francisco Adams, male    DOB: May 26, 1971, 52 y.o.   MRN: [id number]  Chief Complaint  Patient presents with   Weight Loss    Would like to get back on Wegovy   HPI Obesity  Patient is in today for weight loss. He started wegovy one year ago. Reports that it is covered by his insurance for weight loss. States that he started the first dose, then could not obtain medication due to supply issues. Reports that when the supply returned, his rx had expired.  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  Has not completed referral to nutrition or weight loss clinic  Drinks water and tea. Uses mio lemonade in his water.  Eats some greens and lean meats. 90% of red meat he eats is venison.  States he eats a lot of meat and potatoes. States that he eats pretty well, avoids fast foods, reports that he struggles with portion control  Not fasting today.   Hypertension States that he has been checking his BP at home  Ranging 130-140/70-80 Denies changes to vision, CP, DOE, improved edema   Sleep Apnea  States that he uses his Bipap every night and for naps. Denies complaints   Edema  States that this is greatly improved. He states he only takes hydrochlorothiazide as needed.   ROS As per HPI  Objective:  BP 132/84   Pulse 71   Temp (!) 96 F (35.6 C)   Ht 6\' 1"  (1.854 m)   Wt (!) 358 lb (162.4 kg)   SpO2 96%   BMI 47.23 kg/m   Physical Exam Constitutional:      General: He is awake. He is not in acute distress.    Appearance: Normal appearance. He is well-developed and well-groomed. He is morbidly obese. He is not ill-appearing, toxic-appearing or diaphoretic.  Cardiovascular:     Rate and Rhythm: Normal rate.     Pulses: Normal pulses.          Radial pulses are 2+ on the right side and 2+ on the left side.       Posterior tibial pulses are 2+ on the right side and 2+ on the left side.      Heart sounds: Normal heart sounds. No murmur heard.    No gallop.  Pulmonary:     Effort: Pulmonary effort is normal. No respiratory distress.     Breath sounds: Normal breath sounds. No stridor. No wheezing, rhonchi or rales.  Musculoskeletal:     Cervical back: Full passive range of motion without pain and neck supple.     Right lower leg: No edema.     Left lower leg: No edema.     Comments: Right middle finger crepitus   Skin:    General: Skin is warm.     Capillary Refill: Capillary refill takes less than 2 seconds.  Neurological:     General: No focal deficit present.     Mental Status: He is alert, oriented to person, place, and time and easily aroused. Mental status is at baseline.     GCS: GCS eye subscore is 4. GCS verbal subscore is 5. GCS motor subscore is 6.     Motor: No weakness.  Psychiatric:        Attention and Perception: Attention and perception normal.        Mood and Affect: Mood  and affect normal.        Speech: Speech normal.        Behavior: Behavior normal. Behavior is cooperative.        Thought Content: Thought content normal. Thought content does not include homicidal or suicidal ideation. Thought content does not include homicidal or suicidal plan.        Cognition and Memory: Cognition and memory normal.        Judgment: Judgment normal.       04/05/2023   10:04 AM 01/04/2023    1:07 PM 10/08/2022   11:03 AM  Depression screen PHQ 2/9  Decreased Interest 0 0 0  Down, Depressed, Hopeless 0 0 0  PHQ - 2 Score 0 0 0  Altered sleeping 2 2 0  Tired, decreased energy 2 2 0  Change in appetite 0 1 0  Feeling bad or failure about yourself  0 0 0  Trouble concentrating 0 0 0  Moving slowly or fidgety/restless 0 0 0  Suicidal thoughts 0 0 0  PHQ-9 Score 4 5 0  Difficult doing work/chores Somewhat difficult Somewhat difficult Not difficult at all      04/05/2023   10:04 AM 01/04/2023    1:08 PM 06/26/2022    3:58 PM 01/12/2022    9:47 AM  GAD 7 :  Generalized Anxiety Score  Nervous, Anxious, on Edge 0 0 0 0  Control/stop worrying 0 0 0 0  Worry too much - different things 0 0 0 0  Trouble relaxing 0 0 0 0  Restless 0 0 0 0  Easily annoyed or irritable 1 2 0 0  Afraid - awful might happen 0 0 0 0  Total GAD 7 Score 1 2 0 0  Anxiety Difficulty Not difficult at all Somewhat difficult  Not difficult at all   Assessment & Plan:  1. Morbid obesity (HCC) Labs as below. Will communicate results to patient once available.  Will reorder wegovy as below. Discussed with patient that he may benefit from calcium scoring. Patient to consider for future.  Referral placed as below to nutrition.   - CBC with Differential/Platelet - Thyroid Panel With TSH - VITAMIN D 25 Hydroxy (Vit-D Deficiency, Fractures) - CMP14+EGFR - Bayer DCA Hb A1c Waived - Semaglutide-Weight Management 0.25 MG/0.5ML SOAJ; Inject 0.25 mg into the skin once a week for 28 days.  Dispense: 2 mL; Refill: 0 - Semaglutide-Weight Management 0.5 MG/0.5ML SOAJ; Inject 0.5 mg into the skin once a week for 28 days.  Dispense: 2 mL; Refill: 0 - Semaglutide-Weight Management 1 MG/0.5ML SOAJ; Inject 1 mg into the skin once a week for 28 days.  Dispense: 2 mL; Refill: 0 - Semaglutide-Weight Management 1.7 MG/0.75ML SOAJ; Inject 1.7 mg into the skin once a week for 28 days.  Dispense: 3 mL; Refill: 0 - Semaglutide-Weight Management 2.4 MG/0.75ML SOAJ; Inject 2.4 mg into the skin once a week for 28 days.  Dispense: 3 mL; Refill: 0 - Amb ref to Medical Nutrition Therapy-MNT  2. Primary hypertension Instructed patient to start taking hydrochlorothiazide 12.5 mg daily for hypertension control. Discussed side effects with patient. Patient to keep a log of BP measurements and return to clinic in 3 months for follow up.   3. Hypertriglyceridemia Labs as below. Will communicate results to patient once available.  Not fasting  - Lipid panel  4. Sleep apnea treated with nocturnal BiPAP Well  controlled. Continue BiPAP  5. Localized edema Well controlled. Continue current regimen.  The above assessment and management plan was discussed with the patient. The patient verbalized understanding of and has agreed to the management plan using shared-decision making. Patient is aware to call the clinic if they develop any new symptoms or if symptoms fail to improve or worsen. Patient is aware when to return to the clinic for a follow-up visit. Patient educated on when it is appropriate to go to the emergency department.   Return in about 3 months (around 07/06/2023).  Neale Burly, DNP-FNP [facility] [address] [place], [place] [address] [phone]

## 2023-04-06 LAB — LIPID PANEL
Chol/HDL Ratio: 4.6 ratio (ref 0.0–5.0)
Cholesterol, Total: 173 mg/dL (ref 100–199)
HDL: 38 mg/dL — ABNORMAL LOW (ref >39)
VLDL Cholesterol Cal: 24 mg/dL (ref 5–40)

## 2023-04-06 LAB — CMP14+EGFR
BUN: 16 mg/dL (ref 6–24)
Bilirubin Total: 0.4 mg/dL (ref 0.0–1.2)
CO2: 25 mmol/L (ref 20–29)
Calcium: 9.2 mg/dL (ref 8.7–10.2)
Creatinine, Ser: 0.83 mg/dL (ref 0.76–1.27)
Glucose: 100 mg/dL — ABNORMAL HIGH (ref 70–99)
Sodium: 142 mmol/L (ref 134–144)

## 2023-04-06 LAB — CBC WITH DIFFERENTIAL/PLATELET
Basophils Absolute: 0 x10E3/uL (ref 0.0–0.2)
Basos: 0 %
EOS (ABSOLUTE): 0.1 x10E3/uL (ref 0.0–0.4)
Eos: 2 %
Hematocrit: 45.3 % (ref 37.5–51.0)
Hemoglobin: 15.2 g/dL (ref 13.0–17.7)
Immature Grans (Abs): 0.1 x10E3/uL (ref 0.0–0.1)
Immature Granulocytes: 1 %
Lymphocytes Absolute: 1.6 x10E3/uL (ref 0.7–3.1)
Lymphs: 23 %
MCH: 29.6 pg (ref 26.6–33.0)
MCHC: 33.6 g/dL (ref 31.5–35.7)
MCV: 88 fL (ref 79–97)
Monocytes Absolute: 0.6 x10E3/uL (ref 0.1–0.9)
Monocytes: 9 %
Neutrophils Absolute: 4.4 x10E3/uL (ref 1.4–7.0)
Neutrophils: 65 %
Platelets: 228 x10E3/uL (ref 150–450)
RBC: 5.14 x10E6/uL (ref 4.14–5.80)
RDW: 13.1 % (ref 11.6–15.4)
WBC: 6.9 x10E3/uL (ref 3.4–10.8)

## 2023-04-08 NOTE — Progress Notes (Signed)
Cholesterol is elevated. Diet encouraged - increase intake of fresh fruits and vegetables, increase intake of lean proteins. Bake, broil, or grill foods. Avoid fried, greasy, and fatty foods. Avoid fast foods. Increase intake of fiber-rich whole grains. Exercise encouraged - at least 150 minutes per week and advance as tolerated. Can also try red yeast rice and we will recheck in 3 months.  All other labs within normal range.

## 2023-04-11 ENCOUNTER — Other Ambulatory Visit (HOSPITAL_COMMUNITY): Payer: Self-pay

## 2023-04-11 ENCOUNTER — Telehealth: Payer: Self-pay

## 2023-04-11 NOTE — Telephone Encounter (Signed)
Pharmacy Patient Advocate Encounter  Received notification from [employer] that Prior Authorization for Wegovy 0.25MG /0.5ML auto-injectors has been APPROVED from 04-11-2023 to 11-07-2023.Marland Kitchen  PA #/Case ID/Reference #: [id number]  Co-pay is $0.00 per 28 day supply

## 2023-04-24 ENCOUNTER — Other Ambulatory Visit: Payer: Self-pay | Admitting: Family Medicine

## 2023-04-24 DIAGNOSIS — I10 Essential (primary) hypertension: Secondary | ICD-10-CM

## 2023-06-23 ENCOUNTER — Other Ambulatory Visit: Payer: Self-pay | Admitting: Family Medicine

## 2023-06-23 DIAGNOSIS — I10 Essential (primary) hypertension: Secondary | ICD-10-CM

## 2023-06-28 ENCOUNTER — Other Ambulatory Visit: Payer: Self-pay | Admitting: Family Medicine

## 2023-06-28 DIAGNOSIS — Z1211 Encounter for screening for malignant neoplasm of colon: Secondary | ICD-10-CM

## 2023-06-28 DIAGNOSIS — Z1212 Encounter for screening for malignant neoplasm of rectum: Secondary | ICD-10-CM

## 2023-08-18 ENCOUNTER — Other Ambulatory Visit: Payer: Self-pay | Admitting: Family Medicine

## 2023-08-18 DIAGNOSIS — L719 Rosacea, unspecified: Secondary | ICD-10-CM

## 2023-09-22 ENCOUNTER — Other Ambulatory Visit: Payer: Self-pay | Admitting: Family Medicine

## 2023-09-22 DIAGNOSIS — I10 Essential (primary) hypertension: Secondary | ICD-10-CM

## 2023-10-08 NOTE — Progress Notes (Signed)
 Acute Office Visit  Subjective:     Patient ID: Francisco Adams, male    DOB: Jan 13, 1971, 53 y.o.   MRN: [id number]  Chief Complaint  Patient presents with   Cough    Symptoms for couple of weeks had flu but cannot get over the cough   Shortness of Breath    HPI  Francisco Adams 53 year old male present October 09, 2023 for an acute visit concern for cough and wheezing. Reports " had the flu last week, now I am coughing with SOB" Cough: Patient complains of nonproductive cough.  Symptoms began 8 days ago.  The cough is with wheezing, with shortness of breath, with shortness of breath during the cough and is aggravated by  "get it up moving around"  Associated symptoms include:shortness of breath and wheezing. Patient do not have new pets. Patient do not have a history of asthma. Patient does not have a history of environmental allergens. Patient does not have recent travel. Patient does not have a history of smoking. Patient  does not have previous Chest X-ray.  Active Ambulatory Problems    Diagnosis Date Noted   Primary hypertension 05/26/2015   Hypogonadism in male 08/30/2014   Localized edema 05/26/2015   Morbid obesity (HCC) 05/06/2019   Rosacea 06/23/2019   Hypertriglyceridemia 06/23/2019   Seasonal allergies 06/24/2020   Primary erectile dysfunction 07/14/2021   Sleep apnea treated with nocturnal BiPAP 10/23/2021   Bronchitis with influenza 10/09/2023   Acute cough 10/09/2023   Resolved Ambulatory Problems    Diagnosis Date Noted   Morbid obesity with BMI of 45.0-49.9, adult (HCC) 08/30/2014   Palpitations 05/06/2019   Cough 10/14/2020   Chest congestion 10/14/2020   Nausea 10/21/2020   Cellulitis of Right Leg/Foot Post work related Injury---Work Related Rt Leg/Foot Injury --DOI-12/29/2020 01/25/2021   Foot trauma, right, sequela----Work Related Rt Leg/Foot Injury --DOI-12/29/2020 01/26/2021   Past Medical History:  Diagnosis Date   Arthritis    Dermatitis     Hypertension    Insomnia    Sleep apnea    Testosterone  deficiency    Review of Systems  Constitutional:  Negative for chills and fever.  HENT:  Negative for ear pain, sinus pain and sore throat.   Eyes:  Negative for pain.  Respiratory:  Positive for cough, shortness of breath and wheezing.        Nonproductive  Cardiovascular:  Negative for chest pain and leg swelling.  Gastrointestinal:  Negative for constipation, diarrhea, nausea and vomiting.  Musculoskeletal:  Negative for myalgias.  Skin:  Negative for itching and rash.  Neurological:  Negative for dizziness and headaches.  Endo/Heme/Allergies:  Negative for environmental allergies and polydipsia. Does not bruise/bleed easily.  Psychiatric/Behavioral:  The patient has insomnia.        Due to the cough   Negative unless indicated in HPI    Objective:    BP 114/75   Pulse 74   Temp 97.7 F (36.5 C) (Temporal)   Ht 6\' 1"  (1.854 m)   Wt (!) 373 lb (169.2 kg)   SpO2 97%   BMI 49.21 kg/m  BP Readings from Last 3 Encounters:  10/09/23 114/75  04/05/23 132/84  01/04/23 120/70   Wt Readings from Last 3 Encounters:  10/09/23 (!) 373 lb (169.2 kg)  04/05/23 (!) 358 lb (162.4 kg)  01/04/23 (!) 353 lb (160.1 kg)      Physical Exam Vitals and nursing note reviewed.  Constitutional:      Appearance:  He is well-developed. He is obese.  HENT:     Head: Normocephalic and atraumatic.     Right Ear: Tympanic membrane, ear canal and external ear normal. There is no impacted cerumen.     Left Ear: Tympanic membrane, ear canal and external ear normal. There is no impacted cerumen.     Nose: Nose normal. No congestion or rhinorrhea.     Mouth/Throat:     Mouth: Mucous membranes are moist.  Eyes:     General: No scleral icterus.    Extraocular Movements: Extraocular movements intact.     Conjunctiva/sclera: Conjunctivae normal.     Pupils: Pupils are equal, round, and reactive to light.  Cardiovascular:     Rate and Rhythm:  Normal rate and regular rhythm.  Pulmonary:     Effort: Pulmonary effort is normal.     Breath sounds: Wheezing present.  Abdominal:     General: Bowel sounds are normal.     Palpations: Abdomen is soft.  Musculoskeletal:        General: Normal range of motion.     Right lower leg: No edema.     Left lower leg: No edema.  Skin:    General: Skin is warm and dry.     Findings: No rash.  Neurological:     Mental Status: He is alert and oriented to person, place, and time.  Psychiatric:        Mood and Affect: Mood normal.        Behavior: Behavior normal.        Thought Content: Thought content normal.        Judgment: Judgment normal.     No results found for any visits on 10/09/23.      Assessment & Plan:  Bronchitis with influenza -     predniSONE ; Use as directed on back of pill pack  Dispense: 21 tablet; Refill: 0  Acute cough -     Benzonatate ; Take 1 capsule (100 mg total) by mouth 3 (three) times daily as needed.  Dispense: 20 capsule; Refill: 0  Other orders -     Azithromycin ; Take 2-tabs on the first and 1 tab daily until done  Dispense: 6 each; Refill: 0   Francisco Adams is a 53 year old Caucasian male seen today for URI, no acute distress  Cough: Tessalon  Perles 1 tablet as needed 3 times daily client instructed to take a pill with was at least 8 ounces of water Bronchitis due to influenza: Prednisone  Dosepak #21 dispensed client instructed that he will need to take it over 6 days.  Client to follow instruction on the box; Zithromax  No. 6 dispense Increase hydration; Tylenol  ibuprofen for fever Return to work on Monday note provided to apply continue healthy lifestyle choices, including diet (rich in fruits, vegetables, and lean proteins, and low in salt and simple carbohydrates) and exercise (at least 30 minutes of moderate physical activity daily).     The above assessment and management plan was discussed with the patient. The patient verbalized understanding of  and has agreed to the management plan. Patient is aware to call the clinic if they develop any new symptoms or if symptoms persist or worsen. Patient is aware when to return to the clinic for a follow-up visit. Patient educated on when it is appropriate to go to the emergency department.  Return if symptoms worsen or fail to improve.  Takeyah Wieman [place] Thompson, DNP [facility] [address] [place], [place] [address] [phone]  Note: This document was prepared by Dragon voice dictation technology and any errors that results from this process are unintentional.

## 2023-10-09 ENCOUNTER — Encounter: Payer: Self-pay | Admitting: Nurse Practitioner

## 2023-10-09 ENCOUNTER — Ambulatory Visit (INDEPENDENT_AMBULATORY_CARE_PROVIDER_SITE_OTHER): Payer: 59 | Admitting: Nurse Practitioner

## 2023-10-09 VITALS — BP 114/75 | HR 74 | Temp 97.7°F | Ht 73.0 in | Wt 373.0 lb

## 2023-10-09 DIAGNOSIS — R051 Acute cough: Secondary | ICD-10-CM | POA: Insufficient documentation

## 2023-10-09 DIAGNOSIS — J111 Influenza due to unidentified influenza virus with other respiratory manifestations: Secondary | ICD-10-CM | POA: Diagnosis not present

## 2023-10-09 HISTORY — DX: Acute cough: R05.1

## 2023-10-09 MED ORDER — BENZONATATE 100 MG PO CAPS
100.0000 mg | ORAL_CAPSULE | Freq: Three times a day (TID) | ORAL | 0 refills | Status: DC | PRN
Start: 2023-10-09 — End: 2023-11-29

## 2023-10-09 MED ORDER — AZITHROMYCIN 250 MG PO TABS: ORAL_TABLET | ORAL | 0 refills | Status: DC

## 2023-10-09 MED ORDER — PREDNISONE 10 MG (21) PO TBPK
ORAL_TABLET | ORAL | 0 refills | Status: DC
Start: 2023-10-09 — End: 2023-11-29

## 2023-11-12 ENCOUNTER — Other Ambulatory Visit: Payer: Self-pay | Admitting: Family Medicine

## 2023-11-12 DIAGNOSIS — I10 Essential (primary) hypertension: Secondary | ICD-10-CM

## 2023-11-22 ENCOUNTER — Other Ambulatory Visit: Payer: Self-pay | Admitting: Family Medicine

## 2023-11-22 DIAGNOSIS — I10 Essential (primary) hypertension: Secondary | ICD-10-CM

## 2023-11-22 NOTE — Telephone Encounter (Signed)
 Gabrielle pt NTBS 30-d given 09/23/23

## 2023-11-22 NOTE — Telephone Encounter (Signed)
 Made appt 03/07

## 2023-11-29 ENCOUNTER — Ambulatory Visit (INDEPENDENT_AMBULATORY_CARE_PROVIDER_SITE_OTHER): Payer: 59 | Admitting: Family Medicine

## 2023-11-29 ENCOUNTER — Encounter: Payer: Self-pay | Admitting: Family Medicine

## 2023-11-29 VITALS — BP 136/83 | HR 76 | Temp 98.5°F | Ht 73.0 in | Wt 376.0 lb

## 2023-11-29 DIAGNOSIS — I10 Essential (primary) hypertension: Secondary | ICD-10-CM | POA: Diagnosis not present

## 2023-11-29 DIAGNOSIS — E785 Hyperlipidemia, unspecified: Secondary | ICD-10-CM

## 2023-11-29 DIAGNOSIS — E291 Testicular hypofunction: Secondary | ICD-10-CM

## 2023-11-29 DIAGNOSIS — G4730 Sleep apnea, unspecified: Secondary | ICD-10-CM

## 2023-11-29 DIAGNOSIS — L719 Rosacea, unspecified: Secondary | ICD-10-CM

## 2023-11-29 DIAGNOSIS — N529 Male erectile dysfunction, unspecified: Secondary | ICD-10-CM

## 2023-11-29 DIAGNOSIS — R600 Localized edema: Secondary | ICD-10-CM

## 2023-11-29 MED ORDER — SEMAGLUTIDE-WEIGHT MANAGEMENT 0.25 MG/0.5ML ~~LOC~~ SOAJ: 0.2500 mg | SUBCUTANEOUS | 0 refills | Status: AC

## 2023-11-29 MED ORDER — SEMAGLUTIDE-WEIGHT MANAGEMENT 2.4 MG/0.75ML ~~LOC~~ SOAJ: 2.4000 mg | SUBCUTANEOUS | 0 refills | Status: AC

## 2023-11-29 MED ORDER — METOPROLOL SUCCINATE ER 25 MG PO TB24: 25.0000 mg | ORAL_TABLET | Freq: Every day | ORAL | 0 refills | Status: DC

## 2023-11-29 MED ORDER — SEMAGLUTIDE-WEIGHT MANAGEMENT 0.5 MG/0.5ML ~~LOC~~ SOAJ: 0.5000 mg | SUBCUTANEOUS | 0 refills | Status: AC

## 2023-11-29 MED ORDER — SEMAGLUTIDE-WEIGHT MANAGEMENT 1.7 MG/0.75ML ~~LOC~~ SOAJ: 1.7000 mg | SUBCUTANEOUS | 0 refills | Status: AC

## 2023-11-29 MED ORDER — SEMAGLUTIDE-WEIGHT MANAGEMENT 1 MG/0.5ML ~~LOC~~ SOAJ: 1.0000 mg | SUBCUTANEOUS | 0 refills | Status: AC

## 2023-11-29 MED ORDER — OLMESARTAN MEDOXOMIL 40 MG PO TABS: 40.0000 mg | ORAL_TABLET | Freq: Every day | ORAL | 0 refills | Status: DC

## 2023-11-29 MED ORDER — CLOTRIMAZOLE-BETAMETHASONE 1-0.05 % EX CREA: TOPICAL_CREAM | CUTANEOUS | 0 refills | Status: DC | PRN

## 2023-11-29 MED ORDER — HYDROCHLOROTHIAZIDE 25 MG PO TABS: 25.0000 mg | ORAL_TABLET | Freq: Every day | ORAL | 1 refills | Status: AC

## 2023-11-29 MED ORDER — SILDENAFIL CITRATE 100 MG PO TABS: 100.0000 mg | ORAL_TABLET | Freq: Every day | ORAL | 2 refills | Status: AC | PRN

## 2023-11-29 NOTE — Progress Notes (Signed)
 Subjective:  Patient ID: Francisco Adams, male    DOB: December 03, 1970, 53 y.o.   MRN: [id number]  Patient Care Team: Arrie Senate, FNP as PCP - General (Family Medicine)   Chief Complaint:  Medical Management of Chronic Issues  HPI: Francisco Adams is a 53 y.o. male presenting on 11/29/2023 for Medical Management of Chronic Issues  HPI 1. Primary hypertension Has BP monitor at home Yes BP at home average 135-145/70-80 ROS Denies anxiety, fatigue, changes to vision, chest pain, headaches, palpitations, sweats, SOB, PND, orthopnea States that he does have peripheral edema, states that it is sore  Meds hydrochlorothiazide, olmesartan  States that he is not taking hydrochlorothiazide as he should. Reports that he frequently misses afternoon dose. CAD risks hypertension, hypercholesterolemia/hyperlipidemia  2. Sleep apnea treated with nocturnal BiPAP Has not followed up with sleep study. He uses his BiPAP nightly.   3. Hypogonadism in male He is no longer following with urology for testosterone.   4. Dyslipidemia  Lipid/Cholesterol, Follow-up  Last lipid panel Other pertinent labs  Lab Results  Component Value Date   CHOL 173 04/05/2023   HDL 38 (L) 04/05/2023   LDLCALC 111 (H) 04/05/2023   TRIG 136 04/05/2023   CHOLHDL 4.6 04/05/2023   Lab Results  Component Value Date   ALT 21 04/05/2023   AST 15 04/05/2023   PLT 228 04/05/2023   TSH 1.330 04/05/2023     He was last seen for this 7 months ago.  Management since that visit includes nothing.  Symptoms: No chest pain No chest pressure/discomfort  No dyspnea No lower extremity edema  No numbness or tingling of extremity No orthopnea  No palpitations No paroxysmal nocturnal dyspnea  No speech difficulty No syncope   Current diet: well balanced Current exercise: walking at work   The 10-year ASCVD risk score (Arnett DK, et al., 2019) is:  7.8%  ---------------------------------------------------------------------------------------------------  5. Morbid obesity (HCC) Has not determined what insurance will cover. Tried semaglutide in the past, but insurance changed and lost coverage.   6. Primary erectile dysfunction Still taking viagra. Denies side effects.   7. Rosacea Using cream 2 days per week. States that if he forgets he will notice.    Relevant past medical, surgical, family, and social history reviewed and updated as indicated.  Allergies and medications reviewed and updated. Data reviewed: Chart in Epic.   Past Medical History:  Diagnosis Date   Acute cough 10/09/2023   Arthritis    Dermatitis    head  and face    Hypertension    Insomnia    Palpitations    Sleep apnea    cpap    Testosterone deficiency     Past Surgical History:  Procedure Laterality Date   ANKLE RECONSTRUCTION Right    SPINE SURGERY     cervical - 3 disc fusions    Social History   Socioeconomic History   Marital status: Divorced    Spouse name: Not on file   Number of children: 3   Years of education: Not on file   Highest education level: Not on file  Occupational History   Not on file  Tobacco Use   Smoking status: Former   Smokeless tobacco: Current    Types: Chew  Vaping Use   Vaping status: Never Used  Substance and Sexual Activity   Alcohol use: Never   Drug use: Never   Sexual activity: Not on file  Other Topics Concern  Not on file  Social History Narrative   Not on file   Social Drivers of Health   Financial Resource Strain: Not on file  Food Insecurity: Not on file  Transportation Needs: Not on file  Physical Activity: Not on file  Stress: Not on file  Social Connections: Not on file  Intimate Partner Violence: Not on file    Outpatient Encounter Medications as of 11/29/2023  Medication Sig   azithromycin (ZITHROMAX Z-PAK) 250 MG tablet Take 2-tabs on the first and 1 tab daily until done    benzonatate (TESSALON PERLES) 100 MG capsule Take 1 capsule (100 mg total) by mouth 3 (three) times daily as needed.   clotrimazole-betamethasone (LOTRISONE) cream APPLY CREAM TOPICALLY TWICE DAILY   fluticasone (FLONASE) 50 MCG/ACT nasal spray Place 2 sprays into both nostrils daily.   hydrochlorothiazide (HYDRODIURIL) 12.5 MG tablet Take 1 tablet (12.5 mg total) by mouth 2 (two) times daily.   levocetirizine (XYZAL) 5 MG tablet Take 1 tablet (5 mg total) by mouth every evening.   metoprolol succinate (TOPROL-XL) 25 MG 24 hr tablet Take 1 tablet (25 mg total) by mouth daily. **NEEDS TO BE SEEN BEFORE NEXT REFILL**   Multiple Vitamin (MULTIVITAMIN) tablet Take 1 tablet by mouth daily.   olmesartan (BENICAR) 40 MG tablet Take 1 tablet (40 mg total) by mouth daily. **NEEDS TO BE SEEN BEFORE NEXT REFILL**   predniSONE (STERAPRED UNI-PAK 21 TAB) 10 MG (21) TBPK tablet Use as directed on back of pill pack   sildenafil (VIAGRA) 100 MG tablet Take 1 tablet (100 mg total) by mouth daily as needed for erectile dysfunction.   testosterone cypionate (DEPOTESTOSTERONE CYPIONATE) 200 MG/ML injection Inject 0.5 mLs (100 mg total) into the muscle every 7 (seven) days. (Patient not taking: Reported on 10/09/2023)   Vitamin D, Cholecalciferol, 10 MCG (400 UNIT) CAPS Take 1 capsule by mouth daily.   No facility-administered encounter medications on file as of 11/29/2023.    No Known Allergies  Review of Systems As per HPI  Objective:  BP 136/83   Pulse 76   Temp 98.5 F (36.9 C)   Ht 6\' 1"  (1.854 m)   Wt (!) 376 lb (170.6 kg)   SpO2 96%   BMI 49.61 kg/m    Wt Readings from Last 3 Encounters:  10/09/23 (!) 373 lb (169.2 kg)  04/05/23 (!) 358 lb (162.4 kg)  01/04/23 (!) 353 lb (160.1 kg)    Physical Exam Constitutional:      General: He is awake. He is not in acute distress.    Appearance: Normal appearance. He is well-developed and well-groomed. He is morbidly obese. He is not ill-appearing,  toxic-appearing or diaphoretic.  Cardiovascular:     Rate and Rhythm: Normal rate and regular rhythm.     Pulses: Normal pulses.          Radial pulses are 2+ on the right side and 2+ on the left side.       Posterior tibial pulses are 2+ on the right side and 2+ on the left side.     Heart sounds: Normal heart sounds. No murmur heard.    No gallop.  Pulmonary:     Effort: Pulmonary effort is normal. No respiratory distress.     Breath sounds: Normal breath sounds. No stridor. No wheezing, rhonchi or rales.  Musculoskeletal:     Cervical back: Full passive range of motion without pain and neck supple.     Right lower leg: 4+ Edema  present.     Left lower leg: 4+ Edema present.  Skin:    General: Skin is warm.     Capillary Refill: Capillary refill takes less than 2 seconds.     Findings: Erythema present.     Comments: Erythema along forehead, no rash present   Neurological:     General: No focal deficit present.     Mental Status: He is alert, oriented to person, place, and time and easily aroused. Mental status is at baseline.     GCS: GCS eye subscore is 4. GCS verbal subscore is 5. GCS motor subscore is 6.     Motor: No weakness.  Psychiatric:        Attention and Perception: Attention and perception normal.        Mood and Affect: Mood and affect normal.        Speech: Speech normal.        Behavior: Behavior normal. Behavior is cooperative.        Thought Content: Thought content normal. Thought content does not include homicidal or suicidal ideation. Thought content does not include homicidal or suicidal plan.        Cognition and Memory: Cognition and memory normal.        Judgment: Judgment normal.    Results for orders placed or performed in visit on 04/05/23  Bayer DCA Hb A1c Waived   Collection Time: 04/05/23 10:36 AM  Result Value Ref Range   HB A1C (BAYER DCA - WAIVED) 5.2 4.8 - 5.6 %  CBC with Differential/Platelet   Collection Time: 04/05/23 10:38 AM  Result  Value Ref Range   WBC 6.9 3.4 - 10.8 x10E3/uL   RBC 5.14 4.14 - 5.80 x10E6/uL   Hemoglobin 15.2 13.0 - 17.7 g/dL   Hematocrit 52.8 41.3 - 51.0 %   MCV 88 79 - 97 fL   MCH 29.6 26.6 - 33.0 pg   MCHC 33.6 31.5 - 35.7 g/dL   RDW 24.4 01.0 - 27.2 %   Platelets 228 150 - 450 x10E3/uL   Neutrophils 65 Not Estab. %   Lymphs 23 Not Estab. %   Monocytes 9 Not Estab. %   Eos 2 Not Estab. %   Basos 0 Not Estab. %   Neutrophils Absolute 4.4 1.4 - 7.0 x10E3/uL   Lymphocytes Absolute 1.6 0.7 - 3.1 x10E3/uL   Monocytes Absolute 0.6 0.1 - 0.9 x10E3/uL   EOS (ABSOLUTE) 0.1 0.0 - 0.4 x10E3/uL   Basophils Absolute 0.0 0.0 - 0.2 x10E3/uL   Immature Granulocytes 1 Not Estab. %   Immature Grans (Abs) 0.1 0.0 - 0.1 x10E3/uL  Thyroid Panel With TSH   Collection Time: 04/05/23 10:38 AM  Result Value Ref Range   TSH 1.330 0.450 - 4.500 uIU/mL   T4, Total 8.0 4.5 - 12.0 ug/dL   T3 Uptake Ratio 25 24 - 39 %   Free Thyroxine Index 2.0 1.2 - 4.9  VITAMIN D 25 Hydroxy (Vit-D Deficiency, Fractures)   Collection Time: 04/05/23 10:38 AM  Result Value Ref Range   Vit D, 25-Hydroxy 38.8 30.0 - 100.0 ng/mL  CMP14+EGFR   Collection Time: 04/05/23 10:38 AM  Result Value Ref Range   Glucose 100 (H) 70 - 99 mg/dL   BUN 16 6 - 24 mg/dL   Creatinine, Ser 5.36 0.76 - 1.27 mg/dL   eGFR 644 >03 KV/QQV/9.56   BUN/Creatinine Ratio 19 9 - 20   Sodium 142 134 - 144 mmol/L   Potassium 4.5  3.5 - 5.2 mmol/L   Chloride 104 96 - 106 mmol/L   CO2 25 20 - 29 mmol/L   Calcium 9.2 8.7 - 10.2 mg/dL   Total Protein 6.7 6.0 - 8.5 g/dL   Albumin 4.3 3.8 - 4.9 g/dL   Globulin, Total 2.4 1.5 - 4.5 g/dL   Bilirubin Total 0.4 0.0 - 1.2 mg/dL   Alkaline Phosphatase 73 44 - 121 IU/L   AST 15 0 - 40 IU/L   ALT 21 0 - 44 IU/L  Lipid panel   Collection Time: 04/05/23 10:38 AM  Result Value Ref Range   Cholesterol, Total 173 100 - 199 mg/dL   Triglycerides 161 0 - 149 mg/dL   HDL 38 (L) >09 mg/dL   VLDL Cholesterol Cal 24 5 - 40  mg/dL   LDL Chol Calc (NIH) 604 (H) 0 - 99 mg/dL   Chol/HDL Ratio 4.6 0.0 - 5.0 ratio       04/05/2023   10:04 AM 01/04/2023    1:07 PM 10/08/2022   11:03 AM 06/26/2022    3:57 PM 01/12/2022    9:47 AM  Depression screen PHQ 2/9  Decreased Interest 0 0 0 0 0  Down, Depressed, Hopeless 0 0 0 0 0  PHQ - 2 Score 0 0 0 0 0  Altered sleeping 2 2 0 2 0  Tired, decreased energy 2 2 0 1 0  Change in appetite 0 1 0 1 0  Feeling bad or failure about yourself  0 0 0 0 0  Trouble concentrating 0 0 0 1 0  Moving slowly or fidgety/restless 0 0 0 0 0  Suicidal thoughts 0 0 0 0 0  PHQ-9 Score 4 5 0 5 0  Difficult doing work/chores Somewhat difficult Somewhat difficult Not difficult at all Somewhat difficult Not difficult at all       04/05/2023   10:04 AM 01/04/2023    1:08 PM 06/26/2022    3:58 PM 01/12/2022    9:47 AM  GAD 7 : Generalized Anxiety Score  Nervous, Anxious, on Edge 0 0 0 0  Control/stop worrying 0 0 0 0  Worry too much - different things 0 0 0 0  Trouble relaxing 0 0 0 0  Restless 0 0 0 0  Easily annoyed or irritable 1 2 0 0  Afraid - awful might happen 0 0 0 0  Total GAD 7 Score 1 2 0 0  Anxiety Difficulty Not difficult at all Somewhat difficult  Not difficult at all   Pertinent labs & imaging results that were available during my care of the patient were reviewed by me and considered in my medical decision making.  Assessment & Plan:  Yorel was seen today for medical management of chronic issues.  Diagnoses and all orders for this visit:  1. Primary hypertension (Primary) Will refill medications as below. Encouraged patient to take hydrochlorothiazide as prescribed. Will consolidate to one dose in am to help with compliance.  - metoprolol succinate (TOPROL-XL) 25 MG 24 hr tablet; Take 1 tablet (25 mg total) by mouth daily. **NEEDS TO BE SEEN BEFORE NEXT REFILL**  Dispense: 30 tablet; Refill: 0 - olmesartan (BENICAR) 40 MG tablet; Take 1 tablet (40 mg total) by mouth  daily.  Dispense: 90 tablet; Refill: 0 - hydrochlorothiazide (HYDRODIURIL) 25 MG tablet; Take 1 tablet (25 mg total) by mouth daily.  Dispense: 90 tablet; Refill: 1  2. Sleep apnea treated with nocturnal BiPAP Referral placed for patient to  follow up.  - Ambulatory referral to Sleep Studies  3. Hypogonadism in male Not currently following with urology. Patient to follow up with urology if he would like to resume TRT.   4. Dyslipidemia Discussed ascvd risk score and recommendation for statin. Patient would like to try diet and lifestyle modifications first with RYR. Offered patient referral for calcium score, he would like to wait at this time.   5. Morbid obesity (HCC) Will start medications as below. Discussed side effects. Denies history of pancreatitis, MTC.  - Semaglutide-Weight Management 0.25 MG/0.5ML SOAJ; Inject 0.25 mg into the skin once a week for 28 days.  Dispense: 2 mL; Refill: 0 - Semaglutide-Weight Management 0.5 MG/0.5ML SOAJ; Inject 0.5 mg into the skin once a week for 28 days.  Dispense: 2 mL; Refill: 0 - Semaglutide-Weight Management 1 MG/0.5ML SOAJ; Inject 1 mg into the skin once a week for 28 days.  Dispense: 2 mL; Refill: 0 - Semaglutide-Weight Management 1.7 MG/0.75ML SOAJ; Inject 1.7 mg into the skin once a week for 28 days.  Dispense: 3 mL; Refill: 0 - Semaglutide-Weight Management 2.4 MG/0.75ML SOAJ; Inject 2.4 mg into the skin once a week for 28 days.  Dispense: 3 mL; Refill: 0  6. Primary erectile dysfunction Well controlled. Continue current regimen.  - sildenafil (VIAGRA) 100 MG tablet; Take 1 tablet (100 mg total) by mouth daily as needed for erectile dysfunction.  Dispense: 30 tablet; Refill: 2  7. Rosacea Well controlled. Continue current regimen.  - clotrimazole-betamethasone (LOTRISONE) cream; Apply topically as needed.  Dispense: 45 g; Refill: 0  8. Bilateral lower extremity edema Encouraged patient to take hydrochlorothiazide as prescribed. Will  consolidate to one dose in am to help with compliance.  - hydrochlorothiazide (HYDRODIURIL) 25 MG tablet; Take 1 tablet (25 mg total) by mouth daily.  Dispense: 90 tablet; Refill: 1  Continue all other maintenance medications.  Follow up plan: Return in about 3 months (around 02/29/2024) for Chronic Condition Follow up.  Continue healthy lifestyle choices, including diet (rich in fruits, vegetables, and lean proteins, and low in salt and simple carbohydrates) and exercise (at least 30 minutes of moderate physical activity daily).  Written and verbal instructions provided   The above assessment and management plan was discussed with the patient. The patient verbalized understanding of and has agreed to the management plan. Patient is aware to call the clinic if they develop any new symptoms or if symptoms persist or worsen. Patient is aware when to return to the clinic for a follow-up visit. Patient educated on when it is appropriate to go to the emergency department.   Neale Burly, DNP-FNP [facility] [address] [place], [place] [address] [phone]

## 2023-12-05 ENCOUNTER — Telehealth: Payer: Self-pay

## 2023-12-05 ENCOUNTER — Other Ambulatory Visit (HOSPITAL_COMMUNITY): Payer: Self-pay

## 2023-12-05 NOTE — Telephone Encounter (Signed)
 Pharmacy Patient Advocate Encounter   Received notification from CoverMyMeds that prior authorization for [facility] 0.25MG /0.5ML auto-injectors is required/requested.   Insurance verification completed.   The patient is insured through [employer] .   Per test claim: PA required and submitted KEY/EOC/Request #: [id number] APPROVED from 11/05/23 to 07/02/24. Ran test claim, Copay is $0. This test claim was processed through [facility]- copay amounts may vary at other pharmacies due to pharmacy/plan contracts, or as the patient moves through the different stages of their insurance plan.   WGNFAO:[id number]

## 2024-01-01 ENCOUNTER — Telehealth: Payer: Self-pay

## 2024-01-01 NOTE — Telephone Encounter (Signed)
 Called pt and asked him to bring his Machine with him to his Appointment on tomorrow 01/02/24.

## 2024-01-02 ENCOUNTER — Encounter: Payer: Self-pay | Admitting: Neurology

## 2024-01-02 ENCOUNTER — Ambulatory Visit: Admitting: Neurology

## 2024-01-02 VITALS — BP 146/90 | HR 84 | Ht 71.0 in | Wt 377.2 lb

## 2024-01-02 DIAGNOSIS — Z6841 Body Mass Index (BMI) 40.0 and over, adult: Secondary | ICD-10-CM

## 2024-01-02 DIAGNOSIS — G4733 Obstructive sleep apnea (adult) (pediatric): Secondary | ICD-10-CM

## 2024-01-02 DIAGNOSIS — G4731 Primary central sleep apnea: Secondary | ICD-10-CM | POA: Diagnosis not present

## 2024-01-02 DIAGNOSIS — R635 Abnormal weight gain: Secondary | ICD-10-CM | POA: Diagnosis not present

## 2024-01-02 DIAGNOSIS — G4719 Other hypersomnia: Secondary | ICD-10-CM

## 2024-01-02 NOTE — Progress Notes (Signed)
 Subjective:    Patient ID: Francisco Adams is a 53 y.o. male.  HPI    Huston Foley, MD, PhD [facility] [address], [address] [address] [place], [place] [address]  Dear Francisco Adams,   I saw your patient, Francisco Adams, upon your kind request in my sleep clinic today for initial consultation of his sleep disorder, in particular, evaluation of his prior diagnosis of obstructive sleep apnea.  The patient is unaccompanied today.  As you know, Mr. Francisco Adams is a 53 year old male with an underlying medical history of hypertension, palpitations, low testosterone, hyperlipidemia, history of dermatitis, arthritis, degenerative cervical spine disease with status post neck surgery, and morbid obesity with a BMI of over 50, who was previously diagnosed with sleep apnea and placed on BiPAP therapy.  He had sleep testing at [facility] in November 2008, he had a baseline sleep study on 08/17/2007 and I was able to review the results.  He had moderate to severe obstructive sleep apnea with an AHI of 32.6/h, O2 nadir 86%.  He had a subsequent titration study on 08/24/2007 and was started on CPAP therapy but developed central apneas and was switched to BiPAP therapy and eventually to BiPAP ST with a final recommendation of BiPAP ST of 21/16 cm with a backup rate of 14 cm.  We were able to get some data off of his current machine, he has an older BiPAP machine, pressure 21/16, he is compliant with treatment, average usage of over 7 hours, residual AHI around 5/h.  Leak in the past 30 days on the low side from the mask.   He uses a nasal pillows interface, his supplier is [employer].  He goes to bed around 9 and rise time is between 4 and 4:15 AM.  He is divorced and lives alone, he has 3 grown children.  He denies nightly nocturia and recurrent nocturnal morning headaches.  Over the course of the past 10 to 15 years he has had significant weight gain in the realm of 120 pounds.  He works  as a Designer, television/film set.  He quit smoking in 2004, he does not drink any alcohol, no daily caffeine.  He is not aware of any family history of sleep apnea. I reviewed your office note from 11/29/2023.  His Past Medical History Is Significant For: Past Medical History:  Diagnosis Date   Acute cough 10/09/2023   Arthritis    Dermatitis    head  and face    Hypertension    Insomnia    Palpitations    Sleep apnea    cpap    Testosterone deficiency     His Past Surgical History Is Significant For: Past Surgical History:  Procedure Laterality Date   ANKLE RECONSTRUCTION Right    SPINE SURGERY     cervical - 3 disc fusions    His Family History Is Significant For: Family History  Problem Relation Age of Onset   Alcohol abuse Mother    Hypertension Mother    Heart attack Mother    Alcohol abuse Father    Hypertension Father    Heart attack Father    Seizures Brother    AAA (abdominal aortic aneurysm) Brother    Drug abuse Brother     His Social History Is Significant For: Social History   Socioeconomic History   Marital status: Divorced    Spouse name: Not on file   Number of children: 3   Years of education:  Not on file   Highest education level: Not on file  Occupational History   Not on file  Tobacco Use   Smoking status: Former   Smokeless tobacco: Current    Types: Chew  Vaping Use   Vaping status: Never Used  Substance and Sexual Activity   Alcohol use: Never   Drug use: Never   Sexual activity: Not on file  Other Topics Concern   Not on file  Social History Narrative   Not on file   Social Drivers of Health   Financial Resource Strain: Not on file  Food Insecurity: Not on file  Transportation Needs: Not on file  Physical Activity: Not on file  Stress: Not on file  Social Connections: Not on file    His Allergies Are:  No Known Allergies:   His Current Medications Are:  Outpatient Encounter Medications as of 01/02/2024  Medication  Sig   clotrimazole-betamethasone (LOTRISONE) cream Apply topically as needed.   hydrochlorothiazide (HYDRODIURIL) 25 MG tablet Take 1 tablet (25 mg total) by mouth daily.   metoprolol succinate (TOPROL-XL) 25 MG 24 hr tablet Take 1 tablet (25 mg total) by mouth daily. **NEEDS TO BE SEEN BEFORE NEXT REFILL**   Multiple Vitamin (MULTIVITAMIN) tablet Take 1 tablet by mouth daily.   olmesartan (BENICAR) 40 MG tablet Take 1 tablet (40 mg total) by mouth daily.   sildenafil (VIAGRA) 100 MG tablet Take 1 tablet (100 mg total) by mouth daily as needed for erectile dysfunction.   Vitamin D, Cholecalciferol, 10 MCG (400 UNIT) CAPS Take 1 capsule by mouth daily.   fluticasone (FLONASE) 50 MCG/ACT nasal spray Place 2 sprays into both nostrils daily. (Patient not taking: Reported on 01/02/2024)   levocetirizine (XYZAL) 5 MG tablet Take 1 tablet (5 mg total) by mouth every evening. (Patient not taking: Reported on 01/02/2024)   Semaglutide-Weight Management 0.5 MG/0.5ML SOAJ Inject 0.5 mg into the skin once a week for 28 days. (Patient not taking: Reported on 01/02/2024)   [START ON 01/26/2024] Semaglutide-Weight Management 1 MG/0.5ML SOAJ Inject 1 mg into the skin once a week for 28 days. (Patient not taking: Reported on 01/02/2024)   [START ON 02/24/2024] Semaglutide-Weight Management 1.7 MG/0.75ML SOAJ Inject 1.7 mg into the skin once a week for 28 days. (Patient not taking: Reported on 01/02/2024)   [START ON 03/24/2024] Semaglutide-Weight Management 2.4 MG/0.75ML SOAJ Inject 2.4 mg into the skin once a week for 28 days. (Patient not taking: Reported on 01/02/2024)   No facility-administered encounter medications on file as of 01/02/2024.  :   Review of Systems:  Out of a complete 14 point review of systems, all are reviewed and negative with the exception of these symptoms as listed below:  Review of Systems  Neurological:        Room 5 Pt is here Alone. Pt states that he has his machine since 2008. Pt has had  the older model ResMed. ESS 8 FSS 55    Objective:  Neurological Exam  Physical Exam Physical Examination:   Vitals:   01/02/24 1333  BP: (!) 146/90  Pulse: 84    General Examination: The patient is a very pleasant 53 y.o. male in no acute distress. He appears well-developed and well-nourished and well groomed.   HEENT: Normocephalic, atraumatic, pupils are equal, round and reactive to light, extraocular tracking is good without limitation to gaze excursion or nystagmus noted. Hearing is grossly intact. Face is symmetric with normal facial animation. Speech is clear with no  dysarthria noted. There is no hypophonia. There is no lip, neck/head, jaw or voice tremor. Neck is supple with full range of passive and active motion. There are no carotid bruits on auscultation. Oropharynx exam reveals: mild mouth dryness, adequate dental hygiene and moderate airway crowding, due to small airway entry and thicker soft palate, slightly wider uvula, tonsils on the smaller side, Mallampati class III, neck circumference 21 three-quarter inches, minimal overbite noted.  Tongue protrudes centrally and palate elevates symmetrically.  Chest: Clear to auscultation without wheezing, rhonchi or crackles noted.  Heart: S1+S2+0, regular and normal without murmurs, rubs or gallops noted.   Abdomen: Soft, non-tender and non-distended.  Extremities: There is 1-2+ pitting edema in the distal lower extremities bilaterally.  He is wearing compression stockings up to the knees bilaterally.  Skin: Warm and dry without trophic changes noted.   Musculoskeletal: exam reveals no obvious joint deformities.   Neurologically:  Mental status: The patient is awake, alert and oriented in all 4 spheres. His immediate and remote memory, attention, language skills and fund of knowledge are appropriate. There is no evidence of aphasia, agnosia, apraxia or anomia. Speech is clear with normal prosody and enunciation. Thought process  is linear. Mood is normal and affect is normal.  Cranial nerves II - XII are as described above under HEENT exam.  Motor exam: Normal bulk, strength and tone is noted. There is no obvious action or resting tremor.  Fine motor skills and coordination: grossly intact.  Cerebellar testing: No dysmetria or intention tremor. There is no truncal or gait ataxia.  Sensory exam: intact to light touch in the upper and lower extremities.  Gait, station and balance: He stands easily. No veering to one side is noted. No leaning to one side is noted. Posture is age-appropriate and stance is narrow based. Gait shows normal stride length and normal pace. No problems turning are noted.   Assessment and Plan:   In summary, ZEFERINO MOUNTS is a very pleasant 53 y.o.-year old male with an underlying medical history of hypertension, palpitations, low testosterone, hyperlipidemia, history of dermatitis, arthritis, degenerative cervical spine disease with status post neck surgery, and morbid obesity with a BMI of over 50, who presents for evaluation of his obstructive sleep apnea which was in the severe range in 2008.  He has had significant weight gain in the interim, he has been on BiPAP ST at a pressure of 21/16 cm with a backup rate of 14 for years.  Settings have not changed for years, he needs new supplies and an updated machine and new evaluation especially given his weight gain.  Will proceed hopefully with a split-night sleep study through our sleep lab.   A laboratory attended sleep study is typically considered "gold standard" for evaluation of sleep disordered breathing.   I had a long chat with the patient about my findings and the diagnosis of sleep apnea, particularly OSA, its prognosis and treatment options. We talked about medical/conservative treatments, surgical interventions and non-pharmacological approaches for symptom control. I explained, in particular, the risks and ramifications of untreated moderate  to severe OSA, especially with respect to developing cardiovascular disease down the road, including congestive heart failure (CHF), difficult to treat hypertension, cardiac arrhythmias (particularly A-fib), neurovascular complications including TIA, stroke and dementia. Even type 2 diabetes has, in part, been linked to untreated OSA. Symptoms of untreated OSA may include (but may not be limited to) daytime sleepiness, nocturia (i.e. frequent nighttime urination), memory problems, mood irritability and suboptimally  controlled or worsening mood disorder such as depression and/or anxiety, lack of energy, lack of motivation, physical discomfort, as well as recurrent headaches, especially morning or nocturnal headaches. We talked about the importance of maintaining a healthy lifestyle and striving for healthy weight.  I recommended a sleep study at this time. I outlined the differences between a laboratory attended sleep study which is considered more comprehensive and accurate over the option of a home sleep test (HST); the latter may lead to underestimation of sleep disordered breathing in some instances and does not help with diagnosing upper airway resistance syndrome and is not accurate enough to diagnose primary central sleep apnea typically. I outlined possible surgical and non-surgical treatment options of OSA, including the use of a positive airway pressure (PAP) device (i.e. CPAP, AutoPAP/APAP or BiPAP in certain circumstances), a custom-made dental device (aka oral appliance, which would require a referral to a specialist dentist or orthodontist typically, and is generally speaking not considered for patients with full dentures or edentulous state), upper airway surgical options, such as traditional UPPP (which is not considered a first-line treatment) or the Inspire device (hypoglossal nerve stimulator, which would involve a referral for consultation with an ENT surgeon, after careful selection, following  inclusion criteria - also not first-line treatment). I explained the PAP treatment option to the patient in detail, as this is generally considered first-line treatment.  The patient indicated that he would be willing to continue with PAP therapy, if the need arises. I explained the importance of being compliant with PAP treatment, not only for insurance purposes but primarily to improve patient's symptoms symptoms, and for the patient's long term health benefit, including to reduce His cardiovascular risks longer-term.    We will pick up our discussion about the next steps and treatment options after testing.  We will keep him posted as to the test results by phone call and/or MyChart messaging where possible.  We will plan to follow-up in sleep clinic accordingly as well.  I answered all his questions today and the patient was in agreement.   I encouraged him to call with any interim questions, concerns, problems or updates or email [place] through MyChart.  Generally speaking, sleep test authorizations may take up to 2 weeks, sometimes less, sometimes longer, the patient is encouraged to get in touch with [place] if they do not hear back from the sleep lab staff directly within the next 2 weeks.  Thank you very much for allowing me to participate in the care of this nice patient. If I can be of any further assistance to you please do not hesitate to call me at [phone].  Sincerely,   Huston Foley, MD, PhD

## 2024-01-02 NOTE — Patient Instructions (Signed)
Thank you for choosing [facility] for your sleep related care! It was nice to meet you today! I appreciate that you entrust me with your sleep related healthcare concerns. I hope, I was able to address at least some of your concerns today, and that I can help you feel reassured and also get better.    Here is what we discussed today and what we came up with as our plan for you:    Based on your symptoms and your exam I believe you are still at risk for obstructive sleep apnea and would benefit from reevaluation as it has been many years and you need new supplies and updated machine. Therefore, I think we should proceed with a sleep study to determine how severe your sleep apnea is. If you have more than mild OSA, I want you to consider ongoing treatment with CPAP. Please remember, the risks and ramifications of moderate to severe obstructive sleep apnea or OSA are: Cardiovascular disease, including congestive heart failure, stroke, difficult to control hypertension, arrhythmias, and even type 2 diabetes has been linked to untreated OSA. Sleep apnea causes disruption of sleep and sleep deprivation in most cases, which, in turn, can cause recurrent headaches, problems with memory, mood, concentration, focus, and vigilance. Most people with untreated sleep apnea report excessive daytime sleepiness, which can affect their ability to drive. Please do not drive if you feel sleepy.   I will likely see you back after your sleep study to go over the test results and where to go from there. We will call you after your sleep study to advise about the results (most likely, you will hear from Kristen, my nurse) and to set up an appointment at the time, as necessary.    Our sleep lab administrative assistant will call you to schedule your sleep study. If you don't hear back from her by about 2 weeks from now, please feel free to call her at [phone]. You can leave a message with your phone number  and concerns, if you get the voicemail box. She will call back as soon as possible.     

## 2024-01-13 ENCOUNTER — Telehealth: Payer: Self-pay | Admitting: Neurology

## 2024-01-13 NOTE — Telephone Encounter (Signed)
 Split [facility] pending

## 2024-01-16 NOTE — Telephone Encounter (Signed)
Checked status it is still pending.  

## 2024-01-18 ENCOUNTER — Other Ambulatory Visit: Payer: Self-pay | Admitting: Family Medicine

## 2024-01-18 DIAGNOSIS — I10 Essential (primary) hypertension: Secondary | ICD-10-CM

## 2024-01-21 NOTE — Telephone Encounter (Signed)
 Split [facility] [employer] Dress: [id number] (exp. 01/13/24 to 04/13/24)

## 2024-01-27 NOTE — Telephone Encounter (Signed)
 I spoke with the patient he states he will call back to schedule.

## 2024-01-28 NOTE — Telephone Encounter (Signed)
 Patient called back.  Split [employer] Dress: [id number] (exp. 01/13/24 to 04/13/24)   He is scheduled at [facility] for 03/02/24 at 8 pm.  Mailed packet to the patient.

## 2024-02-22 ENCOUNTER — Other Ambulatory Visit: Payer: Self-pay | Admitting: Family Medicine

## 2024-02-22 DIAGNOSIS — I10 Essential (primary) hypertension: Secondary | ICD-10-CM

## 2024-03-02 ENCOUNTER — Ambulatory Visit (INDEPENDENT_AMBULATORY_CARE_PROVIDER_SITE_OTHER): Admitting: Neurology

## 2024-03-02 DIAGNOSIS — R635 Abnormal weight gain: Secondary | ICD-10-CM

## 2024-03-02 DIAGNOSIS — G4731 Primary central sleep apnea: Secondary | ICD-10-CM

## 2024-03-02 DIAGNOSIS — G4720 Circadian rhythm sleep disorder, unspecified type: Secondary | ICD-10-CM

## 2024-03-02 DIAGNOSIS — G4733 Obstructive sleep apnea (adult) (pediatric): Secondary | ICD-10-CM

## 2024-03-02 DIAGNOSIS — G4719 Other hypersomnia: Secondary | ICD-10-CM

## 2024-03-02 DIAGNOSIS — Z789 Other specified health status: Secondary | ICD-10-CM

## 2024-03-04 ENCOUNTER — Encounter: Payer: Self-pay | Admitting: Family Medicine

## 2024-03-04 ENCOUNTER — Ambulatory Visit (INDEPENDENT_AMBULATORY_CARE_PROVIDER_SITE_OTHER)

## 2024-03-04 VITALS — BP 124/79 | HR 82 | Temp 98.3°F | Ht 71.0 in | Wt 378.0 lb

## 2024-03-04 DIAGNOSIS — M25562 Pain in left knee: Secondary | ICD-10-CM | POA: Diagnosis not present

## 2024-03-04 DIAGNOSIS — M25561 Pain in right knee: Secondary | ICD-10-CM | POA: Diagnosis not present

## 2024-03-04 MED ORDER — PREDNISONE 20 MG PO TABS: 40.0000 mg | ORAL_TABLET | Freq: Every day | ORAL | 0 refills | Status: AC

## 2024-03-04 NOTE — Progress Notes (Signed)
 Subjective:  Patient ID: Francisco Adams, male    DOB: 07-Apr-1971, 53 y.o.   MRN: [id number]  Patient Care Team: Chrystine Crate, FNP as PCP - General (Family Medicine)   Chief Complaint:  bilateral knee pain (Left greater than right)  HPI: Francisco Adams is a 53 y.o. male presenting on 03/04/2024 for bilateral knee pain (Left greater than right)  HPI Patient presents today with bilateral knee pain. States that it started in November, then got better for a while. 1.5 months ago worsened. Denies specific injury or trauma. Reports that being on it all day triggers pain. Describes the pain as tearing at his knee cap and tight. Taking naproxen 3 times daily on occasion, which helps some. Denies numbness and tingling. Denies weakness. States that they feel heavy. States that the left will give out on occasion.   Relevant past medical, surgical, family, and social history reviewed and updated as indicated.  Allergies and medications reviewed and updated. Data reviewed: Chart in Epic.   Past Medical History:  Diagnosis Date   Acute cough 10/09/2023   Arthritis    Dermatitis    head  and face    Hypertension    Insomnia    Palpitations    Sleep apnea    cpap    Testosterone  deficiency     Past Surgical History:  Procedure Laterality Date   ANKLE RECONSTRUCTION Right    SPINE SURGERY     cervical - 3 disc fusions    Social History   Socioeconomic History   Marital status: Divorced    Spouse name: Not on file   Number of children: 3   Years of education: Not on file   Highest education level: Not on file  Occupational History   Not on file  Tobacco Use   Smoking status: Former   Smokeless tobacco: Current    Types: Chew  Vaping Use   Vaping status: Never Used  Substance and Sexual Activity   Alcohol use: Never   Drug use: Never   Sexual activity: Not on file  Other Topics Concern   Not on file  Social History Narrative   Not on file   Social Drivers  of Health   Financial Resource Strain: Not on file  Food Insecurity: Not on file  Transportation Needs: Not on file  Physical Activity: Not on file  Stress: Not on file  Social Connections: Not on file  Intimate Partner Violence: Not on file    Outpatient Encounter Medications as of 03/04/2024  Medication Sig   clotrimazole -betamethasone  (LOTRISONE ) cream Apply topically as needed.   hydrochlorothiazide  (HYDRODIURIL ) 25 MG tablet Take 1 tablet (25 mg total) by mouth daily.   levocetirizine (XYZAL ) 5 MG tablet Take 1 tablet (5 mg total) by mouth every evening.   metoprolol  succinate (TOPROL -XL) 25 MG 24 hr tablet TAKE 1 TABLET BY MOUTH ONCE DAILY *APPOINTMENT  NEEDED  FOR  REFILLS*   Multiple Vitamin (MULTIVITAMIN) tablet Take 1 tablet by mouth daily.   olmesartan  (BENICAR ) 40 MG tablet Take 1 tablet by mouth once daily   sildenafil  (VIAGRA ) 100 MG tablet Take 1 tablet (100 mg total) by mouth daily as needed for erectile dysfunction.   Vitamin D , Cholecalciferol, 10 MCG (400 UNIT) CAPS Take 1 capsule by mouth daily.   Semaglutide -Weight Management 1.7 MG/0.75ML SOAJ Inject 1.7 mg into the skin once a week for 28 days. (Patient not taking: Reported on 03/04/2024)   [START ON 03/24/2024]  Semaglutide -Weight Management 2.4 MG/0.75ML SOAJ Inject 2.4 mg into the skin once a week for 28 days. (Patient not taking: Reported on 03/04/2024)   [DISCONTINUED] fluticasone  (FLONASE ) 50 MCG/ACT nasal spray Place 2 sprays into both nostrils daily. (Patient not taking: Reported on 01/02/2024)   No facility-administered encounter medications on file as of 03/04/2024.   No Known Allergies  Review of Systems As per HPI  Objective:  BP 124/79   Pulse 82   Temp 98.3 F (36.8 C)   Ht 5' 11 (1.803 m)   Wt (!) 378 lb (171.5 kg)   SpO2 96%   BMI 52.72 kg/m    Wt Readings from Last 3 Encounters:  01/02/24 (!) 377 lb 3.2 oz (171.1 kg)  11/29/23 (!) 376 lb (170.6 kg)  10/09/23 (!) 373 lb (169.2 kg)    Physical Exam Constitutional:      General: He is awake. He is not in acute distress.    Appearance: Normal appearance. He is well-developed and well-groomed. He is obese. He is not ill-appearing, toxic-appearing or diaphoretic.  Cardiovascular:     Rate and Rhythm: Normal rate and regular rhythm.     Heart sounds: Normal heart sounds. No murmur heard.    No gallop.     Comments: Nonpitting  Pulmonary:     Effort: Pulmonary effort is normal. No respiratory distress.     Breath sounds: Normal breath sounds. No stridor. No wheezing, rhonchi or rales.  Musculoskeletal:     Cervical back: Full passive range of motion without pain and neck supple.     Right knee: Crepitus present. No swelling, deformity, effusion, erythema, ecchymosis, lacerations or bony tenderness. Normal range of motion. No tenderness. Normal alignment and normal meniscus. Normal pulse.     Instability Tests: Anterior drawer test negative.     Left knee: Bony tenderness and crepitus present. No swelling, deformity, effusion, erythema, ecchymosis or lacerations. Normal range of motion. Tenderness present over the patellar tendon. Normal alignment and normal meniscus. Normal pulse.     Instability Tests: Anterior drawer test negative.     Right lower leg: Edema present.     Left lower leg: Edema present.  Skin:    General: Skin is warm.     Capillary Refill: Capillary refill takes less than 2 seconds.  Neurological:     General: No focal deficit present.     Mental Status: He is alert, oriented to person, place, and time and easily aroused. Mental status is at baseline.     GCS: GCS eye subscore is 4. GCS verbal subscore is 5. GCS motor subscore is 6.     Motor: No weakness.  Psychiatric:        Attention and Perception: Attention and perception normal.        Mood and Affect: Mood and affect normal.        Speech: Speech normal.        Behavior: Behavior normal. Behavior is cooperative.        Thought Content:  Thought content normal. Thought content does not include homicidal or suicidal ideation. Thought content does not include homicidal or suicidal plan.        Cognition and Memory: Cognition and memory normal.        Judgment: Judgment normal.    Results for orders placed or performed in visit on 04/05/23  Bayer DCA Hb A1c Waived   Collection Time: 04/05/23 10:36 AM  Result Value Ref Range   HB A1C (BAYER DCA -  WAIVED) 5.2 4.8 - 5.6 %  CBC with Differential/Platelet   Collection Time: 04/05/23 10:38 AM  Result Value Ref Range   WBC 6.9 3.4 - 10.8 x10E3/uL   RBC 5.14 4.14 - 5.80 x10E6/uL   Hemoglobin 15.2 13.0 - 17.7 g/dL   Hematocrit 16.1 09.6 - 51.0 %   MCV 88 79 - 97 fL   MCH 29.6 26.6 - 33.0 pg   MCHC 33.6 31.5 - 35.7 g/dL   RDW 04.5 40.9 - 81.1 %   Platelets 228 150 - 450 x10E3/uL   Neutrophils 65 Not Estab. %   Lymphs 23 Not Estab. %   Monocytes 9 Not Estab. %   Eos 2 Not Estab. %   Basos 0 Not Estab. %   Neutrophils Absolute 4.4 1.4 - 7.0 x10E3/uL   Lymphocytes Absolute 1.6 0.7 - 3.1 x10E3/uL   Monocytes Absolute 0.6 0.1 - 0.9 x10E3/uL   EOS (ABSOLUTE) 0.1 0.0 - 0.4 x10E3/uL   Basophils Absolute 0.0 0.0 - 0.2 x10E3/uL   Immature Granulocytes 1 Not Estab. %   Immature Grans (Abs) 0.1 0.0 - 0.1 x10E3/uL  Thyroid  Panel With TSH   Collection Time: 04/05/23 10:38 AM  Result Value Ref Range   TSH 1.330 0.450 - 4.500 uIU/mL   T4, Total 8.0 4.5 - 12.0 ug/dL   T3 Uptake Ratio 25 24 - 39 %   Free Thyroxine Index 2.0 1.2 - 4.9  VITAMIN D  25 Hydroxy (Vit-D Deficiency, Fractures)   Collection Time: 04/05/23 10:38 AM  Result Value Ref Range   Vit D, 25-Hydroxy 38.8 30.0 - 100.0 ng/mL  CMP14+EGFR   Collection Time: 04/05/23 10:38 AM  Result Value Ref Range   Glucose 100 (H) 70 - 99 mg/dL   BUN 16 6 - 24 mg/dL   Creatinine, Ser 9.14 0.76 - 1.27 mg/dL   eGFR 782 >95 AO/ZHY/8.65   BUN/Creatinine Ratio 19 9 - 20   Sodium 142 134 - 144 mmol/L   Potassium 4.5 3.5 - 5.2 mmol/L    Chloride 104 96 - 106 mmol/L   CO2 25 20 - 29 mmol/L   Calcium 9.2 8.7 - 10.2 mg/dL   Total Protein 6.7 6.0 - 8.5 g/dL   Albumin 4.3 3.8 - 4.9 g/dL   Globulin, Total 2.4 1.5 - 4.5 g/dL   Bilirubin Total 0.4 0.0 - 1.2 mg/dL   Alkaline Phosphatase 73 44 - 121 IU/L   AST 15 0 - 40 IU/L   ALT 21 0 - 44 IU/L  Lipid panel   Collection Time: 04/05/23 10:38 AM  Result Value Ref Range   Cholesterol, Total 173 100 - 199 mg/dL   Triglycerides 784 0 - 149 mg/dL   HDL 38 (L) >69 mg/dL   VLDL Cholesterol Cal 24 5 - 40 mg/dL   LDL Chol Calc (NIH) 629 (H) 0 - 99 mg/dL   Chol/HDL Ratio 4.6 0.0 - 5.0 ratio       03/04/2024    9:30 AM 11/29/2023   11:45 AM 04/05/2023   10:04 AM 01/04/2023    1:07 PM 10/08/2022   11:03 AM  Depression screen PHQ 2/9  Decreased Interest 1 0 0 0 0  Down, Depressed, Hopeless 0 0 0 0 0  PHQ - 2 Score 1 0 0 0 0  Altered sleeping 2 2 2 2  0  Tired, decreased energy 2 2 2 2  0  Change in appetite 1 3 0 1 0  Feeling bad or failure about yourself  0  0 0 0 0  Trouble concentrating 0 0 0 0 0  Moving slowly or fidgety/restless 0 0 0 0 0  Suicidal thoughts 0 0 0 0 0  PHQ-9 Score 6 7 4 5  0  Difficult doing work/chores   Somewhat difficult Somewhat difficult Not difficult at all       03/04/2024    9:31 AM 11/29/2023   11:45 AM 04/05/2023   10:04 AM 01/04/2023    1:08 PM  GAD 7 : Generalized Anxiety Score  Nervous, Anxious, on Edge 0 0 0 0  Control/stop worrying 0 0 0 0  Worry too much - different things 0 0 0 0  Trouble relaxing 0 0 0 0  Restless 0 0 0 0  Easily annoyed or irritable 2 3 1 2   Afraid - awful might happen 0 0 0 0  Total GAD 7 Score 2 3 1 2   Anxiety Difficulty Not difficult at all Not difficult at all Not difficult at all Somewhat difficult   Pertinent labs & imaging results that were available during my care of the patient were reviewed by me and considered in my medical decision making.  Assessment & Plan:  Leeam was seen today for bilateral knee  pain.  Diagnoses and all orders for this visit:  Acute pain of both knees Imaging as below. Will communicate results to patient once available. Will await results to determine next steps.  Will start with conservative management as below.  Patient declines referral to PT at this time. Will await on results for referral to ortho.  -     predniSONE  (DELTASONE ) 20 MG tablet; Take 2 tablets (40 mg total) by mouth daily with breakfast for 5 days. -     DG Knee 1-2 Views Right -     DG Knee 1-2 Views Left  Continue all other maintenance medications.  Follow up plan: Return in about 3 months (around 06/04/2024) for Chronic Condition Follow up.   Continue healthy lifestyle choices, including diet (rich in fruits, vegetables, and lean proteins, and low in salt and simple carbohydrates) and exercise (at least 30 minutes of moderate physical activity daily).  Written and verbal instructions provided   The above assessment and management plan was discussed with the patient. The patient verbalized understanding of and has agreed to the management plan. Patient is aware to call the clinic if they develop any new symptoms or if symptoms persist or worsen. Patient is aware when to return to the clinic for a follow-up visit. Patient educated on when it is appropriate to go to the emergency department.   Jacqualyn Mates, DNP-FNP [facility] [address] [place], [place] [address] [phone]

## 2024-03-05 ENCOUNTER — Ambulatory Visit: Payer: Self-pay | Admitting: Family Medicine

## 2024-03-05 DIAGNOSIS — M1712 Unilateral primary osteoarthritis, left knee: Secondary | ICD-10-CM

## 2024-03-05 NOTE — Progress Notes (Signed)
 OA present bilateral knees, can refer to PT and ortho if patient would like. Does he have a preference on where he goes?

## 2024-03-10 NOTE — Telephone Encounter (Signed)
 Copied from CRM [id number]. Topic: Referral - Request for Referral >> Mar 10, 2024 11:28 AM Crispin Dolphin wrote: Did the patient discuss referral with their provider in the last year? Yes (If No - schedule appointment) (If Yes - send message)  Appointment offered? No  Type of order/referral and detailed reason for visit: Was told to call back after he decided where he wanted to be referred. He prefers Ortho   Preference of office, provider, location: N/A  If referral order, have you been seen by this specialty before? No (If Yes, this issue or another issue? When? Where?  Can we respond through MyChart? Yes

## 2024-03-16 ENCOUNTER — Ambulatory Visit: Payer: Self-pay | Admitting: Neurology

## 2024-03-16 DIAGNOSIS — G4733 Obstructive sleep apnea (adult) (pediatric): Secondary | ICD-10-CM

## 2024-03-16 NOTE — Procedures (Signed)
 Physician Interpretation:     [facility] SPLIT NIGHT INTERPRETATION REPORT   STUDY DATE: 03/02/2024     PATIENT NAME:  Francisco Adams, Francisco Adams         DATE OF BIRTH:  February 25, 1971  PATIENT ID:  [id number]    TYPE OF STUDY:  SPLIT  READING PHYSICIAN: TRUE MAR, MD, PhD SCORING TECHNICIAN: Sheena Fields      Referred by: Cathlene Marry Lenis, FNP  ? History and Indication for Testing: 53 year old male with an underlying medical history of hypertension, palpitations, low testosterone , hyperlipidemia, history of dermatitis, arthritis, degenerative cervical spine disease with status post neck surgery, and morbid obesity with a BMI of over 50, who was previously diagnosed with sleep apnea and placed on BiPAP ST therapy. He was diagnosed with severe sleep apnea in 2008. He has been compliant with his BiPAP ST of 21/16 cm with a backup rate of 14 cm. He is eligible for new equipment. He presents for reevaluation, he has had significant weight fluctuations over time. His Epworth sleepiness score is 8 out of 24, fatigue severity score is 55 out of 63. Height: 71.0 in Weight: 377 lb (BMI 52) Neck Size: 21.8 in      Medications: Lotrisone , Hydrodiuril , Toprol  - XL, Multivitamin, Viagra , Vitamin D , Flonase  DESCRIPTION: A sleep technologist was in attendance for the duration of the recording.  Data collection, scoring, video monitoring, and reporting were performed in compliance with the AASM Manual for the Scoring of Sleep and Associated Events; (Hypopnea is scored based on the criteria listed in Section VIII D. 1b in the AASM Manual V2.6 using a 4% oxygen desaturation rule or Hypopnea is scored based on the criteria listed in Section VIII D. 1a in the AASM Manual V2.6 using 3% oxygen desaturation and /or arousal rule).  A physician certified by the American Board of Sleep Medicine reviewed each epoch of the study.   FINDINGS:  Please refer to the attached summary for  additional quantitative information.  STUDY DETAILS: Lights off was at 20:43: and lights on 05:02: (499 minutes hours in bed). This study was performed with an initial diagnostic portion followed by positive airway pressure titration.  DIAGNOSTIC ANALYSIS   SLEEP CONTINUITY AND SLEEP ARCHITECTURE:  The diagnostic portion of the study began at 20:43 and ended at 00:00, for a recording time of 3h 18.49m minutes.  Total sleep time was 123 minutes minutes (27.9% supine;  0.0% lateral;  72.1% prone, 0.0% REM sleep), with a decreased sleep efficiency at 62.4%. Sleep latency was normal at 20.5 minutes. REM sleep latency was decreased at 0.0 minutes.  Arousal index was 52.5 /hr. Of the total sleep time, the percentage of stage N1 sleep was 32.4%, stage N2 sleep was 67.6%, stage N3 sleep was 0.0%, and REM sleep was 0.0%. There were 0 Stage R periods observed during this portion of the study, 30 awakenings (i.e. transitions to Stage W from any sleep stage), and 100.0 total stage transitions. Wake after sleep onset (WASO) time accounted for 54 minutes.   AROUSAL (Baseline): There were 92.0 arousals in total, for an arousal index of 44.7 arousals/hour.  Of these, 85.0 were identified as respiratory-related arousals (41.3 /hr), 0 were PLM-related arousals (0.0 /hr), and 23 were non-specific arousals (11.2 /hr)   RESPIRATORY MONITORING:  Based on CMS criteria (using a 4% oxygen desaturation rule for scoring hypopneas), there were 1 apneas (0 obstructive; 1 central; 0 mixed), and 93 hypopneas.  Apnea index was 0.0. Hypopnea index  was 37.9. The apnea-hypopnea index was 37.9 overall (30.6 supine; 0.0 REM, 0.0 supine REM). There were 0 respiratory effort-related arousals (RERAs).  The RERA index was 0.0 events/hr. Total respiratory disturbance index (RDI) was 37.9 events/hr. RDI results showed: supine RDI  109.6 /hr; non-supine RDI 10.1 /hr; REM RDI 0.0 /hr, supine REM RDI 0.0 /hr.  Based on AASM criteria (using a 3%  oxygen desaturation and /or arousal rule for scoring hypopneas), there were 1 apneas (0 obstructive; 1 central; 0 mixed), and 93 hypopneas.  Apnea index was 0.0. Hypopnea index was 55.4. The apnea-hypopnea index was 55.4 overall (32.1 supine; 0.0 REM, 0.0 supine REM). There were 0 respiratory effort-related arousals (RERAs). Total respiratory disturbance index (RDI) was 55.4 events/hr. RDI results showed: supine RDI 114.8 /hr; non-supine RDI 32.4 /hr; REM RDI 0.0 /hr, supine REM RDI 0.0 /hr.   Respiratory events were associated with oxyhemoglobin desaturations (nadir 83%) from a normal baseline (mean 95%). Total time spent at, or below 88% was 6.8 minutes, or 5.5%  of total sleep time.    LIMB MOVEMENTS: There were 0 periodic limb movements of sleep (0.0/hr), of which 0 (0.0/hr) were associated with an arousal.   OXIMETRY: Total sleep time spent at, or below 88% was 3.8 minutes, or 3.1% of total sleep time. Snoring was classified as .   BODY POSITION: Duration of total sleep and percent of total sleep in their respective position is as follows: supine 34 minutes minutes (27.9%), non-supine 89.0 minutes (72.1%); right 00 minutes minutes (0.0%), left 00 minutes minutes (0.0%), and prone 89 minutes minutes (72.1%). Total supine REM sleep time was 00 minutes minutes (0.0% of total REM sleep).   Analysis of electrocardiogram activity showed the highest heart rate for the baseline portion of the study was 77.0 beats per minute.  The average heart rate during sleep was 67 bpm, while the highest heart rate for the same period was 74 bpm.    TREATMENT ANALYSIS SLEEP CONTINUITY AND SLEEP ARCHITECTURE:  The treatment portion of the study began at 00:00 and ended at 05:02, for a recording time of 5h 1.37m minutes.  Total sleep time was 247 minutes minutes (26.1% supine;  52.9% lateral; 21.0% prone, 28.5% REM sleep), with a decreased sleep efficiency at 82.1%. Sleep latency was normal at 14.5 minutes. REM sleep  latency was decreased at 36.0 minutes.  Arousal index was 9.5 /hr. Of the total sleep time, the percentage of stage N1 sleep was 5.5%, stage N3 sleep was 3.0%, and REM sleep was 28.5%. There were 3 Stage R periods observed during this portion of the study, 12 awakenings (i.e. transitions to Stage W from any sleep stage), and 48.0 total stage transitions. Wake after sleep onset (WASO) time accounted for 39 minutes.   AROUSAL: There were 32.0 arousals in total, for an arousal index of 7.8 arousals/hour.  Of these, 7.0 were identified as respiratory-related arousals (1.7 /hr), 0 were PLM-related arousals (0.0 /hr), and 32 were non-specific arousals (7.8 /hr)  RESPIRATORY MONITORING:    While on PAP therapy, based on CMS criteria, the apnea-hypopnea index was 3.9 overall (1.9 supine; 2.9 REM).   While on PAP therapy, based on AASM criteria, the apnea-hypopnea index was 9.9 overall (5.8 supine; 5.8 REM).   Respiratory events were associated with oxyhemoglobin desaturation (nadir 88.0%) from a mean of 96.0%.  Total time spent at, or below 88% was 17.7 minutes, or 7.2%  of total sleep time.       LIMB MOVEMENTS: There  were 0 periodic limb movements of sleep (0.0/hr), of which 0 (0.0/hr) were associated with an arousal.   OXIMETRY: Total sleep time spent at, or below 88% was 0.3 minutes, or 0.1% of total sleep time. Snoring was classified as .   BODY POSITION: Duration of total sleep and percent of total sleep in their respective position is as follows: supine 64 minutes minutes (26.1%), non-supine 183.0 minutes (73.9%); right 131 minutes minutes (52.9%), left 00 minutes minutes (0.0%), and prone 52 minutes minutes (21.0%). Total supine REM sleep time was 34 minutes minutes (48.2% of total REM sleep).   Analysis of electrocardiogram activity showed the highest heart rate for the treatment portion of the study was 77.0 beats per minute.  The average heart rate during sleep was 59 bpm, while the highest  heart rate for the same period was 70 bpm.   TITRATION DETAILS (SEE ALSO TABLE AT THE END OF THE REPORT):  The patient qualified for an emergency split sleep study due to severe sleep apnea.  He could not tolerate CPAP therapy at a low pressure or lower BiPAP pressures and was started on BiPAP of 12/8 cm and titrated to a final pressure of 18/13 cm.  He preferred his home mask which is a medium nasal pillows interface from [employer] (P30i).  He was not switched to BiPAP ST as he did not have any significant treatment emergent centrals or central sleep disordered breathing during the baseline portion of the study.  On the final titration pressure of 18/13 cm he did achieve nonsupine REM sleep, O2 nadir of 88%.  AHI was 7.1/h.    EEG: Review of the EEG showed no abnormal electrical discharges and symmetrical bihemispheric findings.      EKG: The EKG revealed normal sinus rhythm (NSR). The average heart rate during the baseline portion of the study was 67 bpm and 59 bpm during the treatment portion of the study.    AUDIO/VIDEO REVIEW:    The audio and video review did not show any abnormal or unusual behaviors, movements, phonations or vocalizations. The patient took 1 restroom break. Snoring was noted, mostly in the mild range, at times moderate, up particularly in the baseline portion of the study, improved with PAP therapy.  POST-STUDY QUESTIONNAIRE:    Post study, the patient indicated, that sleep was the same as usual.   IMPRESSION:    1. Severe Obstructive Sleep Apnea (OSA) 2. CPAP intolerance 3. Dysfunctions associated with sleep stages or arousal from sleep   RECOMMENDATIONS:    1. This patient has severe obstructive sleep apnea. Of note, the absence of REM sleep during the baseline portion of the study likely underestimates his sleep disordered breathing. The patient qualified for an emergency split sleep study per AASM standards. The baseline AHI was 55.4/h, and O2 nadir 83%.  The  patient responded well to PAP therapy eventually but could not tolerate CPAP therapy and lower BiPAP pressures as he is used to using a fairly high BiPAP pressure at home.  Standard BiPAP therapy was utilized during the study and no significant central apneas were noted during the titration.  On standard BiPAP of 18/13 cm his AHI was reduced to 7.1/h, O2 nadir during non-supine REM sleep briefly of 88%.  Given these findings and the fact that he has been treated with a higher BiPAP pressure at home, I recommend home BiPAP therapy of 19/14 cm via medium [employer]Med P30i nasal interface which is the patient's home mask style. The patient will  be advised to continue to be fully compliant with PAP therapy to improve sleep related symptoms and decrease long term cardiovascular risks.  2. Please note, that untreated obstructive sleep apnea may carry additional perioperative morbidity. Patients with significant obstructive sleep apnea should receive perioperative PAP therapy and the surgeons and particularly the anesthesiologist should be informed of the diagnosis and the severity of the sleep disordered breathing. 3. A full night titration study can be considered if he has trouble tolerating standard BiPAP therapy at home or has increase in sleep disordered breathing or evidence of central apneas on the compliance download.  Alternative treatment options are limited secondary to the severity of his sleep disordered breathing.  Concomitant weight loss is highly recommended. 4. This study shows significant sleep fragmentation which was noted primarily in the baseline portion of the study with better consolidated sleep and REM sleep noted during the titration portion of the study.  The absence of REM sleep during the baseline portion of the study likely underestimates his sleep disordered breathing severity.   5. The patient should be cautioned not to drive, work at heights, or operate dangerous or heavy equipment when tired  or sleepy. Review and reiteration of good sleep hygiene measures should be pursued with any patient. 6. The patient will be seen in follow-up in the sleep clinic at [facility] for discussion of the test results, symptom and treatment compliance review, further management strategies, etc. The referring provider will be notified of the test results.   I certify that I have reviewed the entire raw data recording prior to the issuance of this report in accordance with the Standards of Accreditation of the American Academy of Sleep Medicine (AASM).  True Mar, MD, PhD Medical Director, [facility]) Diplomat, ABPN (Neurology and Sleep)             Technical Report:   [facility] Split Summary    General Information  Name: Francisco Adams, Francisco Adams BMI: 52.58 Physician: True Mar, MD  ID: [id number] Height: 71.0 in Technician: Jesusa Haddock, RPSGT  Sex: Male Weight: 377.0 lb Record: [id number]  Age: 33 [06/24/1971] Date: 03/02/2024     Medical & Medication History    53 year old male with an underlying medical history of hypertension, palpitations, low testosterone , hyperlipidemia, history of dermatitis, arthritis, degenerative cervical spine disease with status post neck surgery, and morbid obesity with a BMI of over 50, who was previously diagnosed with sleep apnea and placed on BiPAP therapy. He had sleep testing at [facility] in November 2008, he had a baseline sleep study on 08/17/2007 and I was able to review the results. He had moderate to severe obstructive sleep apnea with an AHI of 32.6/h, O2 nadir 86%. He had a subsequent titration study on 08/24/2007 and was started on CPAP therapy but developed central apneas and was switched to BiPAP therapy and eventually to BiPAP ST with a final recommendation of BiPAP ST of 21/16 cm with a backup rate of 14 cm. We were able to get some data off of his current machine, he  has an older BiPAP machine, pressure 21/16, he is compliant with treatment, average usage of over 7 hours, residual AHI around 5/h. Leak in the past 30 days on the low side from the mask. He uses a nasal pillows interface  Lotrisone , Hydrodiuril , Toprol  - XL, Multivitamin, Viagra , Vitamin D , Flonase    Sleep Disorder      Comments   The patient came  into the sleep lab for a SPLIT study. The patient took Unisom prior to start of study. The patient was split due to AHI being greater than 40. The patient was fitted with ResMed P30i (pillows) size Med. This is the interface the patient uses at home. Oral venting while in REM. The patient is on BiPAP ST 21/16 BUR 14 at home. CPAP was tried at multiple pressures. The patient could not tolerate any of those pressures. The patient was switched to BiPAP. BiPAP was started at 12/8cmH2O. This was the most comfortable setting for the patient to start off. BiPAP was titrated up to 18/13cmH2O due to respiratory events. One restroom break. EKG did not show any obvious cardiac arrhythmias. Mild snoring prior to BiPAP. All sleep stages witnessed. Respiratory events scored with a 3% desat. Respiratory events worse supine. The patient slept supine, lateral and prone. AHI was 53.8 after 2hrs of TST. The patient was not switched to BiPAP ST due to no CSA observed. There were a few transitional centrals.    Baseline Sleep Stage Information Baseline start time: 08:43:02 PM Baseline end time: 12:00:38 AM   Time Total Supine Side Prone Upright  Recording 3h 18.66m 0h 46.3m 0h 8.28m 2h 23.6m 0h 0.44m  Sleep 2h 3.57m 0h 34.61m 0h 0.62m 1h 29.35m 0h 0.46m   Latency N1 N2 N3 REM Onset Per. Slp. Eff.  Actual 0h 0.49m 0h 3.33m 0h 0.33m 0h 0.56m 0h 20.10m 0h 52.23m 62.37%   Stg Dur Wake N1 N2 N3 REM  Total 74.5 40.0 83.5 0.0 0.0  Supine 12.0 11.0 23.5 0.0 0.0  Side 8.0 0.0 0.0 0.0 0.0  Prone 54.5 29.0 60.0 0.0 0.0  Upright 0.0 0.0 0.0 0.0 0.0   Stg % Wake N1 N2 N3 REM  Total 37.6 32.4 67.6  0.0 0.0  Supine 6.1 8.9 19.0 0.0 0.0  Side 4.0 0.0 0.0 0.0 0.0  Prone 27.5 23.5 48.6 0.0 0.0  Upright 0.0 0.0 0.0 0.0 0.0    CPAP Sleep Stage Information CPAP start time: 12:00:38 AM CPAP end time: 05:02:19 AM   Time Total Supine Side Prone Upright  Recording (TRT) 5h 1.68m 1h 42.77m 2h 23.74m 0h 56.32m 0h 0.28m  Sleep (TST) 4h 7.69m 1h 4.26m 2h 11.47m 0h 52.8m 0h 0.76m   Latency N1 N2 N3 REM Onset Per. Slp. Eff.  Actual 0h 0.34m 0h 8.48m 2h 21.48m 0h 36.78m 0h 14.66m 0h 21.40m 82.09%   Stg Dur Wake N1 N2 N3 REM  Total 54.0 13.5 156.0 7.5 70.5  Supine 37.5 4.0 26.5 0.0 34.0  Side 12.5 5.5 89.0 0.0 36.5  Prone 4.0 4.0 40.5 7.5 0.0  Upright 0.0 0.0 0.0 0.0 0.0   Stg % Wake N1 N2 N3 REM  Total 17.9 5.5 63.0 3.0 28.5  Supine 12.4 1.6 10.7 0.0 13.7  Side 4.1 2.2 36.0 0.0 14.7  Prone 1.3 1.6 16.4 3.0 0.0  Upright 0.0 0.0 0.0 0.0 0.0    Baseline Respiratory Information Apnea Summary Sub Supine Side Prone Upright  Total 0 Total 0 0 0 0 0    REM 0 0 0 0 0    NREM 0 0 0 0 0  Obs 0 REM 0 0 0 0 0    NREM 0 0 0 0 0  Mix 0 REM 0 0 0 0 0    NREM 0 0 0 0 0  Cen 0 REM 0 0 0 0 0    NREM 0 0 0 0 0  Rera Summary Sub Supine Side Prone Upright  Total 0 Total 0 0 0 0 0    REM 0 0 0 0 0    NREM 0 0 0 0 0   Hypopnea Summary Sub Supine Side Prone Upright  Total 114 Total 114 66 0 48 0    REM 0 0 0 0 0    NREM 114 66 0 48 0   4% Hypopnea Summary Sub Supine Side Prone Upright  Total (4%) 78 Total 78 63 0 15 0    REM 0 0 0 0 0    NREM 78 63 0 15 0     AHI Total Obs Mix Cen  55.38 Apnea 0.00 0.00 0.00 0.00   Hypopnea 55.38 -- -- --  37.89 Hypopnea (4%) 37.89 -- -- --    Total Supine Side Prone Upright  Position AHI 55.38 114.78 0.00 32.36 0.00  REM AHI 0.00   NREM AHI 55.38   Position RDI 55.38 114.78 0.00 32.36 0.00  REM RDI 0.00   NREM RDI 55.38    4% Hypopnea Total Supine Side Prone Upright  Position AHI (4%) 37.89 109.57 0.00 10.11 0.00  REM AHI (4%) 0.00   NREM AHI (4%) 37.89    Position RDI (4%) 37.89 109.57 0.00 10.11 0.00  REM RDI (4%) 0.00   NREM RDI (4%) 37.89    CPAP Respiratory Information Apnea Summary Sub Supine Side Prone Upright  Total 1 Total 1 1 0 0 0    REM 0 0 0 0 0    NREM 1 1 0 0 0  Obs 0 REM 0 0 0 0 0    NREM 0 0 0 0 0  Mix 0 REM 0 0 0 0 0    NREM 0 0 0 0 0  Cen 1 REM 0 0 0 0 0    NREM 1 1 0 0 0   Rera Summary Sub Supine Side Prone Upright  Total 0 Total 0 0 0 0 0    REM 0 0 0 0 0    NREM 0 0 0 0 0   Hypopnea Summary Sub Supine Side Prone Upright  Total 40 Total 40 23 16 1  0    REM 24 11 13  0 0    NREM 16 12 3 1  0   4% Hypopnea Summary Sub Supine Side Prone Upright  Total (4%) 15 Total 15 7 8  0 0    REM 12 5 7  0 0    NREM 3 2 1  0 0     AHI Total Obs Mix Cen  9.94 Apnea 0.24 0.00 0.00 0.24   Hypopnea 9.70 -- -- --  3.88 Hypopnea (4%) 3.64 -- -- --    Total Supine Side Prone Upright  Position AHI 9.94 22.33 7.33 1.15 0.00  REM AHI 20.43   NREM AHI 5.76   Position RDI 9.94 22.33 7.33 1.15 0.00  REM RDI 20.43   NREM RDI 5.76    4% Hypopnea Total Supine Side Prone Upright  Position AHI (4%) 3.88 7.44 3.66 0.00 0.00  REM AHI (4%) 10.21   NREM AHI (4%) 1.36   Position RDI (4%) 3.88 7.44 3.66 0.00 0.00  REM RDI (4%) 10.21   NREM RDI (4%) 1.36    Desaturation Information (Baseline)  <100% <90% <80% <70% <60% <50% <40%  Supine 73 44 0 0 0 0 0  Side 1 0 0 0 0 0 0  Prone 47 11 0  0 0 0 0  Upright 0 0 0 0 0 0 0  Total 121 55 0 0 0 0 0  Desaturation threshold setting: 3% Minimum desaturation setting: 10 seconds SaO2 nadir: 83% The longest event was a 54 sec obstructive Hypopneawith a minimum SaO2 of 90%. The lowest SaO2 was 50% associated with a 17 sec obstructive Hypopnea. Awakening/Arousal Information (Baseline) # of Awakenings 30  Wake after sleep onset 54.80m  Wake after persistent sleep 39.73m   Arousal Assoc. Arousals Index  Apneas 0 0.0  Hypopneas 85 41.3  Leg Movements 0 0.0  Snore 0.0 0.0  PTT Arousals 0  0.0  Spontaneous 23 11.2  Total 108 52.5   Desaturation Information (CPAP)  <100% <90% <80% <70% <60% <50% <40%  Supine 32 3 0 0 0 0 0  Side 20 0 0 0 0 0 0  Prone 3 0 0 0 0 0 0  Upright 0 0 0 0 0 0 0  Total 55 3 0 0 0 0 0  Desaturation threshold setting: 3% Minimum desaturation setting: 10 seconds SaO2 nadir: 87% The longest event was a 40 sec obstructive Hypopnea with a minimum SaO2 of 93%. The lowest SaO2 was 88% associated with a 22 sec obstructive Hypopnea. Awakening/Arousal Information (CPAP) # of Awakenings 12  Wake after sleep onset 39.28m  Wake after persistent sleep 34.36m   Arousal Assoc. Arousals Index  Apneas 1 0.2  Hypopneas 6 1.5  Leg Movements 0 0.0  Snore 0.0 0.0  PTT Arousals 0 0.0  Spontaneous 33 8.0  Total 40 9.7     EKG Rates (Baseline) EKG Avg Max Min  Awake 70 85 60  Asleep 67 74 60  EKG Events: N/A Myoclonus Information (Baseline) PLMS LMs Index  Total LMs during PLMS 0 0.0  LMs w/ Microarousals 0 0.0   LM LMs Index  w/ Microarousal 0 0.0  w/ Awakening 0 0.0  w/ Resp Event 0 0.0  Spontaneous 0 0.0  Total 0 0.0   EKG Rates (CPAP) EKG Avg Max Min  Awake 66 77 55  Asleep 59 70 54  EKG Events: N/A Myoclonus Information (CPAP) PLMS LMs Index  Total LMs during PLMS 0 0.0  LMs w/ Microarousals 0 0.0   LM LMs Index  w/ Microarousal 0 0.0  w/ Awakening 0 0.0  w/ Resp Event 0 0.0  Spontaneous 2 0.5  Total 2 0.5      Titration Table:  [facility] CPAP/Bilevel Report    General Information  Name: Admir, Candelas BMI: 52 Physician: Buck Saucer  ID: [id number] Height: 71 in Technician: Harvey Brisker  Sex: Male Weight: 377 lb Record: [id number]  Age: 61 [Feb 21, 1971] Date: 03/02/2024 Scorer: Brisker Fields   Recommended Settings IPAP: N/A cmH20 EPAP: N/A cmH2O AHI: N/A AHI (4%): N/A   Pressure IPAP/EPAP 00 12 / 08 13 / 09 14 / 10 15 / 11 16 / 11 17 / 12 18 / 13   O2 Vol 0.0 0.0 0.0 0.0 0.0 0.0 0.0  0.0  Time TRT 202.22m 26.41m 15.30m 20.60m 16.44m 98.13m 101.65m 20.52m   TST 123.91m 10.8m 15.16m 20.85m 16.81m 72.49m 97.66m 17.28m  Sleep Stage % Wake 38.9 61.5 0.0 0.0 3.0 26.5 3.9 17.1   % REM 0.0 0.0 0.0 72.5 100.0 11.1 21.0 67.6   % N1 32.4 30.0 3.3 2.5 0.0 6.3 3.1 11.8   % N2 67.6 70.0 96.7 25.0 0.0 72.2 75.9 20.6   % N3 0.0  0.0 0.0 0.0 0.0 10.4 0.0 0.0  Respiratory Total Events 114 9 4 5 6 5 10 2    Obs. Apn. 0 0 0 0 0 0 0 0   Mixed Apn. 0 0 0 0 0 0 0 0   Cen. Apn. 0 1 0 0 0 0 0 0   Hypopneas 114 8 4 5 6 5 10 2    AHI 55.38 54.00 16.00 15.00 22.50 4.17 6.15 7.06   Supine AHI 114.78 54.00 16.00 15.00 22.50 0.00 0.00 0.00   Prone AHI 32.36 0.00 0.00 0.00 0.00 1.29 0.00 0.00   Side AHI 0.00 0.00 0.00 0.00 0.00 10.91 6.15 10.43  Respiratory (4%) Hypopneas (4%) 78.00 2.00 0.00 4.00 1.00 3.00 5.00 0.00   AHI (4%) 37.89 18.00 0.00 12.00 3.75 2.50 3.08 0.00   Supine AHI (4%) 109.57 18.00 0.00 12.00 3.75 0.00 0.00 0.00   Prone AHI (4%) 10.11 0.00 0.00 0.00 0.00 0.00 0.00 0.00   Side AHI (4%) 0.00 0.00 0.00 0.00 0.00 8.18 3.08 0.00  Desat Profile <= 90% 46.60m 0.64m 0.43m 2.1m 0.73m 15.56m 0.62m 0.82m   <= 80% 4.36m 0.38m 0.67m 0.4m 0.12m 14.36m 0.31m 0.39m   <= 70% 4.65m 0.62m 0.31m 0.66m 0.63m 14.63m 0.28m 0.35m   <= 60% 4.69m 0.84m 0.51m 0.20m 0.54m 14.66m 0.88m 0.57m  Arousal Index Apnea 0.0 6.0 0.0 0.0 0.0 0.0 0.0 0.0   Hypopnea 41.3 0.0 12.0 0.0 0.0 1.7 0.6 0.0   LM 0.0 0.0 0.0 0.0 0.0 0.0 0.0 0.0   Spontaneous 11.2 24.0 0.0 6.0 3.8 5.8 10.5 7.1

## 2024-03-17 NOTE — Telephone Encounter (Signed)
-----   Message from True Mar sent at 03/16/2024  5:43 PM EDT ----- Urgent set up requested on PAP therapy, due to severe OSA. Patient referred by PCP for re-eval of his OSA (currently on BiPAP ST, old machine), seen by me on 01/02/24, patient had a split night sleep study on 03/02/24. Please call and notify patient that the recent sleep study showed severe obstructive sleep apnea (OSA). He did well with BiPAP during the study with significant improvement of the respiratory events.  I would like start the patient on a new BiPAP machine for home use. I placed the order in the chart.  Please advise patient that we will need a follow up appointment with either myself or one of our nurse practitioners in about 2-3 months post set-up to check for how they are doing on treatment and  how well it's going with the machine in general. Most insurance company require a certain compliance percentage to continue to cover/pay for the machine. Please ask patient to schedule this FU  appointment, according to the set-up date, which is the day they receive the machine. Please make sure, the patient understands the importance of keeping this window for the FU appointment, as it is  often an Barista and not our rule. Failing to adhere to this may result in losing coverage for sleep apnea treatment, at which point some insurances require repeating the whole process.  Plus, monitoring compliance data is usually good feedback for the patient as far as how they are doing, how many hours they are on it, how well the mask fits, etc.  Also remind patient, that any PAP machine or mask issues should be first addressed with the DME company, who provided the machine/supplies.  Please ask if patient has a preference regarding DME company, may depend on the insurance too.  True Mar, MD, PhD [facility])   ----- Message ----- From: Mar True, MD Sent: 03/16/2024   5:40 PM EDT To: True Mar,  MD

## 2024-03-18 NOTE — Telephone Encounter (Signed)
 I called pt relayed results.  Severe osa. Uses bipap already and Dr. Buck ordered new machine.  Will use [facility] in [facility]  [phone].  Appt made for follow up 06-04-2024 at 1515 for initial bipap.  Pt verbalized understanding.  He uses over 4 hrs nightly.

## 2024-03-18 NOTE — Telephone Encounter (Signed)
-----   Message from True Mar sent at 03/16/2024  5:43 PM EDT ----- Urgent set up requested on PAP therapy, due to severe OSA. Patient referred by PCP for re-eval of his OSA (currently on BiPAP ST, old machine), seen by me on 01/02/24, patient had a split night sleep study on 03/02/24. Please call and notify patient that the recent sleep study showed severe obstructive sleep apnea (OSA). He did well with BiPAP during the study with significant improvement of the respiratory events.  I would like start the patient on a new BiPAP machine for home use. I placed the order in the chart.  Please advise patient that we will need a follow up appointment with either myself or one of our nurse practitioners in about 2-3 months post set-up to check for how they are doing on treatment and  how well it's going with the machine in general. Most insurance company require a certain compliance percentage to continue to cover/pay for the machine. Please ask patient to schedule this FU  appointment, according to the set-up date, which is the day they receive the machine. Please make sure, the patient understands the importance of keeping this window for the FU appointment, as it is  often an Barista and not our rule. Failing to adhere to this may result in losing coverage for sleep apnea treatment, at which point some insurances require repeating the whole process.  Plus, monitoring compliance data is usually good feedback for the patient as far as how they are doing, how many hours they are on it, how well the mask fits, etc.  Also remind patient, that any PAP machine or mask issues should be first addressed with the DME company, who provided the machine/supplies.  Please ask if patient has a preference regarding DME company, may depend on the insurance too.  True Mar, MD, PhD [facility])   ----- Message ----- From: Mar True, MD Sent: 03/16/2024   5:40 PM EDT To: True Mar,  MD

## 2024-03-23 NOTE — Telephone Encounter (Signed)
 RE: new bipap machine  severe osa  (currently using bipap) Received: 4 days ago New, Adine Neysa Nena GORMAN, RN; Joylene Carlean Sheree Leveda Viktoria Dortha Jackson Avelina; 1 other Received, thank you!     Previous Messages    ----- Message ----- From: Neysa Nena GORMAN, RN Sent: 03/18/2024   4:58 PM EDT To: Adine Joylene; Avelina Jackson; Ephraim Viktoria* Subject: new bipap machine  severe osa  (currently us *  New order in [facility] for pt for new bipap machine.  Needs new DME  [facility] in [place].  Francisco Adams, 53 y.o., Francisco Adams MRN: [id number] Phone: [phone] ([phone]  Thanks  Particia

## 2024-05-26 ENCOUNTER — Other Ambulatory Visit: Payer: Self-pay | Admitting: *Deleted

## 2024-05-26 DIAGNOSIS — I10 Essential (primary) hypertension: Secondary | ICD-10-CM

## 2024-05-26 MED ORDER — OLMESARTAN MEDOXOMIL 40 MG PO TABS: 40.0000 mg | ORAL_TABLET | Freq: Every day | ORAL | 1 refills | Status: AC

## 2024-06-02 ENCOUNTER — Telehealth: Payer: Self-pay | Admitting: Neurology

## 2024-06-02 NOTE — Telephone Encounter (Signed)
 LVM and sent mychart msg informing pt of need to reschedule 06/04/24 appt - MD out

## 2024-06-04 ENCOUNTER — Telehealth: Admitting: Neurology

## 2024-06-18 ENCOUNTER — Other Ambulatory Visit: Payer: Self-pay | Admitting: *Deleted

## 2024-06-18 DIAGNOSIS — L719 Rosacea, unspecified: Secondary | ICD-10-CM

## 2024-06-18 MED ORDER — CLOTRIMAZOLE-BETAMETHASONE 1-0.05 % EX CREA: TOPICAL_CREAM | CUTANEOUS | 0 refills | Status: AC | PRN

## 2024-07-06 ENCOUNTER — Ambulatory Visit: Payer: Self-pay

## 2024-07-06 NOTE — Telephone Encounter (Signed)
 FYI Only or Action Required?: FYI only for provider.  Patient was last seen in primary care on 03/04/2024 by Cathlene Marry Lenis, FNP.  Called Nurse Triage reporting Back Pain.  Symptoms began several days ago.  Interventions attempted: OTC medications: tylenol , aleve.  Symptoms are: stable.  Triage Disposition: See PCP When Office is Open (Within 3 Days)  Patient/caregiver understands and will follow disposition?: Yes       Copied from CRM [id number]. Topic: Clinical - Red Word Triage >> Jul 06, 2024  9:04 AM Carlyon D wrote: Red Word that prompted transfer to Nurse Triage: Pt has severe back pain since Thursday states he has no idea what happened. Reason for Disposition  [1] MODERATE back pain (e.g., interferes with normal activities) AND [2] present > 3 days  Answer Assessment - Initial Assessment Questions 1. ONSET: When did the pain begin? (e.g., minutes, hours, days)     Thursday morning upon waking 2. LOCATION: Where does it hurt? (upper, mid or lower back)     Low left side of back, sharp pain 3. SEVERITY: How bad is the pain?  (e.g., Scale 1-10; mild, moderate, or severe)     First gets up in the morning its crazy stiff 4. PATTERN: Is the pain constant? (e.g., yes, no; constant, intermittent)     intermittent 5. RADIATION: Does the pain shoot into your legs or somewhere else?     no 6. CAUSE:  What do you think is causing the back pain?      Pulled muscle 7. BACK OVERUSE:  Any recent lifting of heavy objects, strenuous work or exercise?     No more than normally  8. MEDICINES: What have you taken so far for the pain? (e.g., nothing, acetaminophen , NSAIDS)     Tylenol , advil, aleve, tumeric 9. NEUROLOGIC SYMPTOMS: Do you have any weakness, numbness, or problems with bowel/bladder control?     no 10. OTHER SYMPTOMS: Do you have any other symptoms? (e.g., fever, abdomen pain, burning with urination, blood in urine)       no  Protocols used:  Back Pain-A-AH

## 2024-07-06 NOTE — Telephone Encounter (Signed)
 Appt made.

## 2024-07-07 ENCOUNTER — Ambulatory Visit (INDEPENDENT_AMBULATORY_CARE_PROVIDER_SITE_OTHER): Admitting: Family Medicine

## 2024-07-07 ENCOUNTER — Encounter: Payer: Self-pay | Admitting: Family Medicine

## 2024-07-07 VITALS — BP 143/82 | HR 86 | Temp 98.0°F | Ht 71.0 in | Wt 379.4 lb

## 2024-07-07 DIAGNOSIS — M62830 Muscle spasm of back: Secondary | ICD-10-CM

## 2024-07-07 MED ORDER — METHOCARBAMOL 750 MG PO TABS: 750.0000 mg | ORAL_TABLET | Freq: Three times a day (TID) | ORAL | 0 refills | Status: AC | PRN

## 2024-07-07 MED ORDER — METHYLPREDNISOLONE ACETATE 80 MG/ML IJ SUSP
80.0000 mg | Freq: Once | INTRAMUSCULAR | Status: AC
  Administered 2024-07-07: 80 mg via INTRAMUSCULAR

## 2024-07-07 MED ORDER — PREDNISONE 10 MG (21) PO TBPK: ORAL_TABLET | ORAL | 0 refills | Status: AC

## 2024-07-07 NOTE — Progress Notes (Signed)
 Subjective: CC: Low back pain PCP: Cathlene Marry Lenis, FNP YEP:Francisco Adams is a 53 y.o. male presenting to clinic today for:  Low back pain Patient reports that he has had low back pain since Thursday.  He had no preceding injury but actually just stepped out of a car and it started hurting.  This is a recurrent issue that occurred many times over the years but has not been an issue over the last couple of years.  He denies any radiation down the lower extremities and no reported sensory changes nor any weakness.  No changes in bowel or bladder habits.  He has been utilizing his inversion table, oral analgesics OTC and topicals but nothing really is helping.  He does not see a spinal specialist.  He has had imaging in the past and was told he had degenerative changes   ROS: Per HPI  No Known Allergies Past Medical History:  Diagnosis Date   Acute cough 10/09/2023   Arthritis    Dermatitis    head  and face    Hypertension    Insomnia    Palpitations    Sleep apnea    cpap    Testosterone  deficiency     Current Outpatient Medications:    clotrimazole -betamethasone  (LOTRISONE ) cream, Apply topically as needed., Disp: 45 g, Rfl: 0   hydrochlorothiazide  (HYDRODIURIL ) 25 MG tablet, Take 1 tablet (25 mg total) by mouth daily., Disp: 90 tablet, Rfl: 1   levocetirizine (XYZAL ) 5 MG tablet, Take 1 tablet (5 mg total) by mouth every evening., Disp: 90 tablet, Rfl: 3   metoprolol  succinate (TOPROL -XL) 25 MG 24 hr tablet, TAKE 1 TABLET BY MOUTH ONCE DAILY *APPOINTMENT  NEEDED  FOR  REFILLS*, Disp: 90 tablet, Rfl: 1   Multiple Vitamin (MULTIVITAMIN) tablet, Take 1 tablet by mouth daily., Disp: , Rfl:    olmesartan  (BENICAR ) 40 MG tablet, Take 1 tablet (40 mg total) by mouth daily., Disp: 90 tablet, Rfl: 1   sildenafil  (VIAGRA ) 100 MG tablet, Take 1 tablet (100 mg total) by mouth daily as needed for erectile dysfunction., Disp: 30 tablet, Rfl: 2   Vitamin D , Cholecalciferol, 10 MCG  (400 UNIT) CAPS, Take 1 capsule by mouth daily., Disp: , Rfl:  Social History   Socioeconomic History   Marital status: Divorced    Spouse name: Not on file   Number of children: 3   Years of education: Not on file   Highest education level: Not on file  Occupational History   Not on file  Tobacco Use   Smoking status: Former   Smokeless tobacco: Current    Types: Chew  Vaping Use   Vaping status: Never Used  Substance and Sexual Activity   Alcohol use: Never   Drug use: Never   Sexual activity: Not on file  Other Topics Concern   Not on file  Social History Narrative   Not on file   Social Drivers of Health   Financial Resource Strain: Not on file  Food Insecurity: Not on file  Transportation Needs: Not on file  Physical Activity: Not on file  Stress: Not on file  Social Connections: Not on file  Intimate Partner Violence: Not on file   Family History  Problem Relation Age of Onset   Alcohol abuse Mother    Hypertension Mother    Heart attack Mother    Alcohol abuse Father    Hypertension Father    Heart attack Father    Seizures Brother  AAA (abdominal aortic aneurysm) Brother    Drug abuse Brother     Objective: Office vital signs reviewed. BP (!) 143/82   Pulse 86   Temp 98 F (36.7 C)   Ht 5' 11 (1.803 m)   Wt (!) 379 lb 6.4 oz (172.1 kg)   SpO2 95%   BMI 52.92 kg/m   Physical Examination:  General: Awake, alert, morbidly obese, No acute distress HEENT: Sclera slightly injected.  Moist mucous membranes. MSK: Ambulating independently with slightly antalgic gait.  His station is hunched, particularly when getting up from a seated position which appears to be uncomfortable for him.  No midline tenderness palpation to the lumbar spine.  He has tenderness palpation at the lumbosacral junction across the entire low back.  No palpable deformities or abnormalities except for increased paraspinal muscle tonicity  Assessment/ Plan: 53 y.o. male    Spasm of lumbar paraspinous muscle - Plan: methylPREDNISolone  acetate (DEPO-MEDROL ) injection 80 mg, predniSONE  (STERAPRED UNI-PAK 21 TAB) 10 MG (21) TBPK tablet, methocarbamol (ROBAXIN) 750 MG tablet   Sounds like a lumbosacral spasm.  He was given a Depo-Medrol  80 intramuscularly and he may start prednisone  burst tomorrow.  Robaxin given for as needed use but caution sedation.  Work note provided.  Home care instructions were reviewed and reasons for reevaluation discussed.  He understands red flag signs and symptoms which would warrant emergent evaluation.  Follow-up as needed with new PCP   Norene CHRISTELLA Fielding, DO [facility] [phone]

## 2024-08-03 ENCOUNTER — Other Ambulatory Visit: Payer: Self-pay | Admitting: *Deleted

## 2024-08-03 DIAGNOSIS — I10 Essential (primary) hypertension: Secondary | ICD-10-CM

## 2024-08-03 MED ORDER — METOPROLOL SUCCINATE ER 25 MG PO TB24: 25.0000 mg | ORAL_TABLET | Freq: Every day | ORAL | 0 refills | Status: DC

## 2024-09-04 ENCOUNTER — Ambulatory Visit: Payer: Self-pay | Admitting: Family Medicine

## 2024-10-02 ENCOUNTER — Ambulatory Visit: Payer: Worker's Compensation | Admitting: Orthopedic Surgery

## 2024-10-02 ENCOUNTER — Encounter: Payer: Self-pay | Admitting: Orthopedic Surgery

## 2024-10-02 VITALS — BP 146/83 | HR 78 | Ht 71.0 in | Wt 390.0 lb

## 2024-10-02 DIAGNOSIS — M170 Bilateral primary osteoarthritis of knee: Secondary | ICD-10-CM | POA: Diagnosis not present

## 2024-10-02 DIAGNOSIS — M1711 Unilateral primary osteoarthritis, right knee: Secondary | ICD-10-CM

## 2024-10-02 DIAGNOSIS — M1712 Unilateral primary osteoarthritis, left knee: Secondary | ICD-10-CM

## 2024-10-02 NOTE — Patient Instructions (Signed)

## 2024-10-02 NOTE — Progress Notes (Unsigned)
 New Patient Visit  Summary: Francisco Adams is a 54 y.o. male with the following: Bilateral knee arthritis   Assessment & Plan Bilateral primary osteoarthritis of the knees Chronic, progressive osteoarthritis with mild to moderate joint space narrowing and osteophyte formation. Left knee moderately affected; right knee mild to moderate but more symptomatic. The patient has tried pharmacologic therapy, including prednisone , Tylenol , ibuprofen, and glucosamine, but has not had any injections or physical therapy for his knees before. Current BMI precludes knee arthroplasty. - Administered intra-articular corticosteroid injections with lidocaine to both knees. - Explained corticosteroid injections provide symptom management but do not alter disease progression. - Reviewed untried conservative measures: bracing, physical therapy. - Advised knee arthroplasty consideration if symptoms progress and fail conservative management, but not currently a candidate due to BMI. - Provided education on osteoarthritis progression and variability between radiographic findings and symptoms.  Morbid obesity BMI of 54 significantly increases risk of complications with knee arthroplasty, including implant malposition, impaired healing, and infection. Insurance may deny coverage if BMI exceeds 40. Excess weight exacerbates knee joint stress and accelerates osteoarthritis progression. - Discussed impact of morbid obesity on knee joint stress and osteoarthritis progression. - Educated on BMI's relationship to knee arthroplasty candidacy, insurance limitations, and increased perioperative risks.    Procedure note injection Left knee joint   Verbal consent was obtained to inject the left knee joint  Timeout was completed to confirm the site of injection.  The skin was prepped with alcohol and ethyl chloride was sprayed at the injection site.  A 21-gauge needle was used to inject 40 mg of Depo-Medrol  and 1% lidocaine (4  cc) into the left knee using an anterolateral approach.  There were no complications. A sterile bandage was applied.    Procedure note injection Right knee joint   Verbal consent was obtained to inject the right knee joint  Timeout was completed to confirm the site of injection.  The skin was prepped with alcohol and ethyl chloride was sprayed at the injection site.  A 21-gauge needle was used to inject 40 mg of Depo-Medrol  and 1% lidocaine (4 cc) into the right knee using an anterolateral approach.  There were no complications. A sterile bandage was applied.    Follow-up: No follow-ups on file.  Subjective:  Chief Complaint  Patient presents with   Knee Pain    Bilat R > L for 8 mos getting worse.      Discussed the use of AI scribe software for clinical note transcription with the patient, who gave verbal consent to proceed.  History of Present Illness Francisco Adams is a 54 year old male with bilateral knee osteoarthritis and morbid obesity who presents for evaluation of chronic bilateral knee pain, worse on the right.  He has had bilateral knee pain for at least eight months, right worse than left. Over the past six weeks the pain has intensified, especially with sit-to-stand transitions. Right knee pain is localized to the lateral and superior aspect and feels ripped. Both knees have intermittent flares of tenderness, though symptoms are less severe today.  He denies prior knee injury or twisting event. In recent weeks he has developed mechanical symptoms in both knees including popping and cracking, with popping and grinding more prominent in the left knee. He has not used braces, injections, or physical therapy and has managed symptoms conservatively.  His family physician obtained knee radiographs and told him he had fluid in the right knee and arthritis. A prior prednisone  course  provided significant relief. He currently uses acetaminophen , ibuprofen, and glucosamine for  knee pain.  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 He denies diabetes and otherwise feels in good health.    Review of Systems: No fevers or chills*** No numbness or tingling No chest pain No shortness of breath No bowel or bladder dysfunction No GI distress No headaches   Medical History:  Past Medical History:  Diagnosis Date   Acute cough 10/09/2023   Arthritis    Dermatitis    head  and face    Hypertension    Insomnia    Palpitations    Sleep apnea    cpap    Testosterone  deficiency     Past Surgical History:  Procedure Laterality Date   ANKLE RECONSTRUCTION Right    SPINE SURGERY     cervical - 3 disc fusions    Family History  Problem Relation Age of Onset   Alcohol abuse Mother    Hypertension Mother    Heart attack Mother    Alcohol abuse Father    Hypertension Father    Heart attack Father    Seizures Brother    AAA (abdominal aortic aneurysm) Brother    Drug abuse Brother    Social History[1]  Allergies[2]  Active Medications[3]  Objective: BP (!) 146/83   Pulse 78   Ht 5' 11 (1.803 m)   Wt (!) 390 lb (176.9 kg)   BMI 54.39 kg/m   Physical Exam:    General: {General PE Findings:25791} Gait: {Gait:25792}  Physical Exam MEASUREMENTS: BMI- 54.0. MUSCULOSKELETAL: Right knee popping and grinding with tenderness.   IMAGING: {XR Reviewed:24899}     New Medications:  No orders of the defined types were placed in this encounter.     Portions of this note were completed via Scientist, clinical (histocompatibility and immunogenetics).  Oneil DELENA Horde, MD  10/02/2024 10:09 AM      [1]  Social History Tobacco Use   Smoking status: Former   Smokeless tobacco: Current    Types: Chew  Vaping Use   Vaping status: Never Used  Substance Use Topics   Alcohol use: Never   Drug use: Never  [2] No Known Allergies [3]  No outpatient medications have been marked as taking for the 10/02/24 encounter (Office  Visit) with Horde Oneil DELENA, MD.

## 2024-10-08 DIAGNOSIS — M8700 Idiopathic aseptic necrosis of unspecified bone: Secondary | ICD-10-CM | POA: Insufficient documentation

## 2024-10-09 ENCOUNTER — Ambulatory Visit: Payer: Worker's Compensation | Admitting: Family Medicine

## 2024-10-22 ENCOUNTER — Ambulatory Visit: Payer: Worker's Compensation | Admitting: Family Medicine

## 2024-10-27 ENCOUNTER — Other Ambulatory Visit: Payer: Self-pay | Admitting: Family Medicine

## 2024-10-27 DIAGNOSIS — I10 Essential (primary) hypertension: Secondary | ICD-10-CM

## 2024-10-30 ENCOUNTER — Ambulatory Visit: Payer: Worker's Compensation | Admitting: Family Medicine

## 2024-11-20 ENCOUNTER — Ambulatory Visit: Payer: Worker's Compensation | Admitting: Family Medicine
